# Patient Record
Sex: Female | Born: 1945 | Race: White | Hispanic: No | Marital: Married
Health system: Midwestern US, Academic
[De-identification: ages and names within clinical notes are randomized; demographics above are authoritative.]

## PROBLEM LIST (undated history)

## (undated) DIAGNOSIS — M1990 Unspecified osteoarthritis, unspecified site: Secondary | ICD-10-CM

## (undated) DIAGNOSIS — I10 Essential (primary) hypertension: Secondary | ICD-10-CM

## (undated) DIAGNOSIS — I2699 Other pulmonary embolism without acute cor pulmonale: Secondary | ICD-10-CM

## (undated) DIAGNOSIS — R7983 Abnormal findings of blood amino-acid level: Secondary | ICD-10-CM

## (undated) HISTORY — PX: SHOULDER ARTHROSCOPY: SHX128

## (undated) HISTORY — PX: CHOLECYSTECTOMY: SHX55

## (undated) HISTORY — PX: ABDOMINAL HYSTERECTOMY: SHX81

---

## 1999-05-10 ENCOUNTER — Encounter: Payer: Self-pay | Admitting: Family Medicine

## 1999-05-10 ENCOUNTER — Encounter: Admission: RE | Admit: 1999-05-10 | Discharge: 1999-05-10 | Payer: Self-pay | Admitting: Family Medicine

## 1999-05-17 ENCOUNTER — Encounter: Payer: Self-pay | Admitting: General Surgery

## 1999-05-19 ENCOUNTER — Encounter: Payer: Self-pay | Admitting: General Surgery

## 1999-05-19 ENCOUNTER — Ambulatory Visit (HOSPITAL_COMMUNITY): Admission: RE | Admit: 1999-05-19 | Discharge: 1999-05-20 | Payer: Self-pay | Admitting: General Surgery

## 1999-05-20 ENCOUNTER — Encounter: Payer: Self-pay | Admitting: General Surgery

## 2013-05-05 ENCOUNTER — Telehealth

## 2013-05-05 MED ORDER — FA-PYRIDOXINE-CYANOCOBALAMIN 2.5-25-2 MG PO TABS
ORAL_TABLET | Freq: Every day | ORAL | Status: DC
Start: 2013-05-05 — End: 2013-10-06

## 2013-05-05 NOTE — Telephone Encounter (Signed)
[facility] [email or web address]

## 2013-05-05 NOTE — Telephone Encounter (Signed)
Patient told will call back tomorrow,will check with Dr Arbie Cookey

## 2013-05-05 NOTE — Telephone Encounter (Signed)
Dr Arbie Cookey stated ok to refill, rx sent to pharmacy. Patient verbalize understanding.

## 2013-05-13 ENCOUNTER — Telehealth

## 2013-05-13 NOTE — Telephone Encounter (Signed)
Patient called about she got a statement for her insurance and the Xarelto is going to cost her too much. Patient is requesting to be switched back to Coumadin. [email or web address]

## 2013-05-14 NOTE — Telephone Encounter (Signed)
Dr Arbie Cookey stated ok to stop the Xarelto, and 24 hours later to restart Coumadin 3 mg daily, and have a INR check 5 days later. Appt date 05/20/13 10:30 am [facility] office. The patient stated her last Xarelto was taken on Tues 05/13/13 10 pm, ok per Talbert Forest to start the coumadin today 05/14/13 at 10 pm. Patient verbalize understanding.

## 2013-05-16 NOTE — Telephone Encounter (Signed)
See previous note

## 2013-05-20 LAB — PROTIME-INR: INR: 2.2 — ABNORMAL HIGH (ref 0.8–1.2)

## 2013-05-20 NOTE — Progress Notes (Signed)
INT 2.2. Patient has been on coumadin 3 mg for 5 days. Instructed to continue same and check in 1 week. Verbalized understanding.

## 2013-05-27 LAB — PROTIME-INR: INR: 2.9 — ABNORMAL HIGH (ref 0.8–1.2)

## 2013-05-27 NOTE — Progress Notes (Signed)
INR 2.9. Patient will continue 3 mg coumadin a day Check INR in 1 week. Verbalized understanding.

## 2013-06-03 LAB — PROTIME-INR: INR: 3.4 — ABNORMAL HIGH (ref 0.8–1.2)

## 2013-06-03 NOTE — Progress Notes (Signed)
Paylin's INR today is 3.4    Current Coumadin dose is 3mg  daily.    Per MD order, Dois Davenport instructed to continue current Coumadin dose.    RTC for next INR in 2 weeks on 06/17/13    Pt verbalized understanding.

## 2013-06-17 LAB — PROTIME-INR: INR: 4.1 — ABNORMAL HIGH (ref 0.8–1.2)

## 2013-06-17 NOTE — Progress Notes (Signed)
INR 4.1  Instructed to hold coumadin 1 day  Check INR in 2 week per Dr Bosie HelperKudalkar's order.  Verbalized understanding.

## 2013-07-01 LAB — PROTIME-INR: INR: 2.6 — ABNORMAL HIGH (ref 0.8–1.2)

## 2013-07-01 MED ORDER — WARFARIN SODIUM 3 MG PO TABS
3 MG | ORAL_TABLET | Freq: Every day | ORAL | Status: AC
Start: 2013-07-01 — End: ?

## 2013-07-01 NOTE — Progress Notes (Signed)
INR 2.6. (goal 3-3.5) She states she is alternating 2 mg with 3 mg. Last documentation is 3 mg qd. Instructed to take 3 mg qd and have the INR checked in 1 week. Verbalized understanding.

## 2013-07-08 ENCOUNTER — Encounter

## 2013-07-08 LAB — PROTIME-INR: INR: 2.8 — ABNORMAL HIGH (ref 0.8–1.2)

## 2013-07-08 MED ORDER — WARFARIN SODIUM 6 MG PO TABS
6 MG | ORAL_TABLET | ORAL | Status: DC
Start: 2013-07-08 — End: 2013-07-29

## 2013-07-08 NOTE — Progress Notes (Signed)
Inr should be 3.0-3.5. INR 2.8 on Coumadin 3mg  PO daily. Instructed the patient to take 6mg  for one day then resume 3mg . Recheck INR in one week. Understanding verbalized.

## 2013-07-15 LAB — PROTIME-INR: INR: 3.7 — ABNORMAL HIGH (ref 0.8–1.2)

## 2013-07-15 MED ORDER — WARFARIN SODIUM 6 MG PO TABS
6 MG | ORAL_TABLET | ORAL | Status: DC
Start: 2013-07-15 — End: 2013-09-15

## 2013-07-15 NOTE — Progress Notes (Signed)
INR 3.7. She would like to stay on 3 mg qd. Informed she can stay on 3 mg qd per Dr Arbie CookeyKudalkar. INR in 2 weeks. Verbalized understanding.

## 2013-07-29 LAB — PROTIME-INR: INR: 3 — ABNORMAL HIGH (ref 0.8–1.2)

## 2013-07-29 NOTE — Progress Notes (Signed)
INR 3. Instructed to continue 3 mg coumadin daily. Check INR in 4 weeks. Verbalized understanding.

## 2013-08-26 LAB — PROTIME-INR: INR: 3 — ABNORMAL HIGH (ref 0.8–1.2)

## 2013-08-26 NOTE — Progress Notes (Signed)
INR 3.0. Instructed to continue 3 mg coumadin daily. Check INR at next appt 09/15/13. Verbalized understanding.

## 2013-09-15 LAB — PROTIME-INR: INR: 2.7 — ABNORMAL HIGH (ref 0.8–1.2)

## 2013-09-15 MED ORDER — WARFARIN SODIUM 6 MG PO TABS
6 MG | ORAL_TABLET | ORAL | Status: DC
Start: 2013-09-15 — End: 2014-09-21

## 2013-09-15 NOTE — Progress Notes (Signed)
HEMATOLOGY FOLLOW-UP:       Primary Hematologist:  Nilda RiggsPrasad Chynna Buerkle     DIAGNOSIS:      1. Recurrent pulmonary embolisms.    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    TREATMENTS GIVEN:         CURRENT TREATMENT:     Jantoven    OTHER MEDICAL PROBLEMS:     1. RA  2. HTN  3. Hyperlipidemia    INTERVAL HISTORY:       Patient returns to office for f/u  Doing well.   Could not afford Xarelto, so she is back on Warfarin    No headaches, Dizziness  No mouth sores,   No CP, SOB, Cough  No nausea. Anorexia +. No constipation, diarrhea.  No urinary frequency.  No leg swelling  No tingling, numbness in extremities.  No skin rashes.  No fever, chills, night sweats.      PHYSICAL EXAM:       BP 110/80    Pulse 92    Temp(Src) 98.4 ??F (36.9 ??C) (Oral)    Resp 16    Ht 5\' 3"  (1.6 m)    Wt 194 lb (87.998 kg)    BMI 34.37 kg/m2    Performance Status 0     General appearance: alert and cooperative  Head: Normocephalic, without obvious abnormality, atraumatic  Eyes: No Pallor, icterus  Oral mucosa: No mucositis, thrush  Neck: Supple, No fullness  Lungs: Clear to auscultation bilaterally, no audible rales, wheezes or crackles. Respiratory efforts normal.  Heart: Regular rate and rhythm, S1, S2 normal  Abdomen: Soft, non-tender; bowel sounds normal; no masses,  no organomegaly. Not distended.  Extremities: without cyanosis, clubbing, edema or asymmetry  Skin: No jaundice, purpura or petechiae  Neuro: No focal deficits, cranial nerve palsies      LABS:     CBC:   No results found for this basename: WBC, NEUTROABS, HGB, MCV, PLT       BMP:   No results found for this basename: NA, K, CHLORIDE, CO2, GLU, BUN, CREATININE, MG, TSH       HEPATIC:  No results found for this basename: AST, ALT, ALKPHOS, PROT, ALB, BILITOT, BILIDIR, GGT, URICACID, LDH       TUMOR MARKERS:   No results found for this basename: PSA, CEA, CA125, CA2729, CA199         IMAGING:         ASSESSMENT AND PLAN:       1.  Patient with recurrent pulmonary  embolisms.  The patient is currently on Jantoven and is tolerating it well.  It will be adjusted to maintain INR between 3 and 3.5.    2. Homocysteinemia: Foltx will be continued.    CBC and CMP from [facility] was reviewed.    F/U IN 1 YEAR    Nilda RiggsPrasad Larken Urias, MD       cc:  Raeanne BarryJoel Reginelli, M.D.  Zachery DauerBerthold Pembaur, M.D.  Nira ConnSri Koneru, M.D.

## 2013-09-16 NOTE — Telephone Encounter (Signed)
noted 

## 2013-09-16 NOTE — Telephone Encounter (Signed)
Patient moved appt for lab only from 4/22 to 4/21   Done /p f

## 2013-09-30 LAB — PROTIME-INR: INR: 4.1 — ABNORMAL HIGH (ref 0.8–1.2)

## 2013-09-30 NOTE — Progress Notes (Signed)
Please refer to the lab/only visit from today for additional documentation on labs drawn.  PT/INR,drawn today. Patient without prior bruising, discoloration or other noted abnormalities to the draw site.  There was minimalbruising or bleeding noted post blood draw at the site.  Patient without complaints.

## 2013-09-30 NOTE — Progress Notes (Signed)
INR goal 3.0-4.0. INR 4.1 instructed to hold todays dose then resume at 3mg  PO daily. Recheck INR in two weeks. Understanding verbalized.

## 2013-10-06 ENCOUNTER — Telehealth

## 2013-10-06 MED ORDER — FA-PYRIDOXINE-CYANOCOBALAMIN 2.5-25-2 MG PO TABS
ORAL_TABLET | Freq: Every day | ORAL | Status: DC
Start: 2013-10-06 — End: 2014-02-02

## 2013-10-06 NOTE — Telephone Encounter (Signed)
rx sent to pharmacy patient called.

## 2013-10-06 NOTE — Telephone Encounter (Signed)
The patient called with the following medication request: folic acid-pyridoxine-cyancobalamin (FOLTX) 2.5-25-2 MG TABS with b12 combination. Patient states that the office was supposed to call in the medication but the pharmacy has heard nothing.   [facility], [phone].  Trula Sladeacruey

## 2013-10-14 LAB — PROTIME-INR: INR: 2.9 — ABNORMAL HIGH (ref 0.8–1.2)

## 2013-10-14 NOTE — Progress Notes (Signed)
INR 3.0. Instructed to continue 3 mg coumadin a day. INR in 4 weeks. Call if any sign of bleeding or bruising. Verbalized understanding.

## 2013-10-14 NOTE — Progress Notes (Signed)
Please refer to the lab/ visit from today for additional documentation on labs drawn.  PT/INR,  drawn today. Patient without prior bruising, discoloration or other noted abnormalities to the draw site.  There was minimalbruising or bleeding noted post blood draw at the site.  Patient without complaints.  Patient requesting more refills on script when calling in.  Having trouble when calling Rx in, taking 4-5 days.

## 2013-11-05 NOTE — Telephone Encounter (Signed)
Catherine Brewer from [employer] called about clarification on an electronic prescription she received. She stated it wasn't urgent and could be handled next day.

## 2013-11-06 NOTE — Telephone Encounter (Signed)
Lelon Mast called for clarification of prescription for foltx. Please call back.

## 2013-11-06 NOTE — Telephone Encounter (Signed)
Dr Arbie Cookey stated okay to give Folbic.pharmacy called.

## 2013-11-06 NOTE — Telephone Encounter (Signed)
Dr Arbie Cookey stated okay to give the [facility], pharmacy called.

## 2013-11-11 LAB — PROTIME-INR: INR: 3.2 — ABNORMAL HIGH (ref 0.8–1.2)

## 2013-11-11 NOTE — Progress Notes (Signed)
INR 3.2. Instructed to continue 3 mg coumadin a day. Check the INR in 4 weeks. Verbalized understanding.

## 2013-11-11 NOTE — Progress Notes (Signed)
Please refer to the lab/ visit from today for additional documentation on labs drawn.  PT/INR,  drawn today. Patient without prior bruising, discoloration or other noted abnormalities to the draw site.  There was minimalbruising or bleeding noted post blood draw at the site.  Patient with complaints.  Patient states she has swelling in right leg below knee and pain goes all the was down shin.

## 2013-11-11 NOTE — Telephone Encounter (Signed)
Noted.

## 2013-11-11 NOTE — Telephone Encounter (Signed)
Patient called to change lab appointment time on 12/09/13 from 3 pm to 9 am due to other appointment. Appointment has been changed. [place]

## 2013-12-09 LAB — PROTIME-INR: INR: 3.3 — ABNORMAL HIGH (ref 0.8–1.2)

## 2013-12-09 NOTE — Progress Notes (Signed)
INR 3.3. Patient to continue coumadin 3 mg qd. Check the INR in 4 weeks.

## 2013-12-09 NOTE — Progress Notes (Signed)
Please refer to the lab/ visit from today for additional documentation on labs drawn.  PT/INR,  drawn today. Patient without prior bruising, discoloration or other noted abnormalities to the draw site.  There was minimalbruising or bleeding noted post blood draw at the site.  Patient without complaints.

## 2013-12-29 NOTE — Telephone Encounter (Signed)
Patient would like to know if she will remain on her coumadin, she will be having an epidural to relieve pain from a bulging disc. Please call to advise.

## 2013-12-29 NOTE — Telephone Encounter (Signed)
Informed it is OK with Dr Arbie Cookey for her to stay on coumadin for the steroid injection, per Dr Arbie Cookey. Verbalized understanding.

## 2014-01-06 LAB — PROTIME-INR: INR: 3.1 — ABNORMAL HIGH (ref 0.8–1.2)

## 2014-01-06 NOTE — Progress Notes (Signed)
Please refer to the lab/ visit from today for additional documentation on labs drawn.  PT/INR,  drawn today. Patient without prior bruising, discoloration or other noted abnormalities to the draw site.  There was minimalbruising or bleeding noted post blood draw at the site.  Patient without complaints.

## 2014-02-02 ENCOUNTER — Telehealth

## 2014-02-02 MED ORDER — FA-PYRIDOXINE-CYANOCOBALAMIN 2.5-25-2 MG PO TABS
ORAL_TABLET | Freq: Every day | ORAL | Status: DC
Start: 2014-02-02 — End: 2014-05-29

## 2014-02-02 NOTE — Telephone Encounter (Signed)
Patient called requesting refill on FOLTX 2.5-25-2mg . Pharmacy- [facility], Phone [phone]. Catherine Brewer

## 2014-02-03 LAB — PROTIME-INR: INR: 3.1 — ABNORMAL HIGH (ref 0.8–1.2)

## 2014-02-03 NOTE — Progress Notes (Signed)
INR 3.1. Instructed to continue coumadin 3 mg qd. Check INR 4 weeks.

## 2014-02-03 NOTE — Progress Notes (Signed)
Please refer to the lab/ visit from today for additional documentation on labs drawn.  PT/INR,  drawn today. Patient without prior bruising, discoloration or other noted abnormalities to the draw site.  There was minimalbruising or bleeding noted post blood draw at the site.  Patient without complaints.

## 2014-03-03 LAB — PROTIME-INR: INR: 3.2 — ABNORMAL HIGH (ref 0.8–1.2)

## 2014-03-03 NOTE — Progress Notes (Signed)
INR 3.2. Message left for patient to continue coumadin 3 mg qd. Check the INR in 4 weeks. Call if questions.

## 2014-03-03 NOTE — Progress Notes (Signed)
Please refer to the labnurse* visit from today for additional documentation on labs drawn.  PT/INR,drawn today. Patient without prior bruising, discoloration or other noted abnormalities to the draw site.  There was minimalbruising or bleeding noted post blood draw at the site.  Patient without complaints.

## 2014-03-31 LAB — PROTIME-INR: INR: 3.6 — ABNORMAL HIGH (ref 0.8–1.2)

## 2014-03-31 NOTE — Progress Notes (Signed)
INR 3.6. Instructed to hold coumadin today then take 3 mg daily. Check INR in 2 weeks. Per Dr Bosie HelperKudalkar's order. Verbalized understanding.

## 2014-03-31 NOTE — Progress Notes (Signed)
Please refer to the lab visit from today for additional documentation on labs drawn.  PT/INR drawn today. Patient without prior bruising, discoloration or other noted abnormalities to the draw site.  There was minimalbruising or bleeding noted post blood draw at the site.  Patient without complaints.

## 2014-04-13 ENCOUNTER — Encounter: Admit: 2014-04-13 | Discharge: 2014-04-13 | Payer: MEDICARE

## 2014-04-13 ENCOUNTER — Ambulatory Visit: Payer: MEDICARE | Attending: Hematology & Oncology

## 2014-04-13 DIAGNOSIS — Z7901 Long term (current) use of anticoagulants: Secondary | ICD-10-CM

## 2014-04-13 LAB — PROTIME-INR: INR: 2.3 — ABNORMAL HIGH (ref 0.8–1.2)

## 2014-04-13 NOTE — Progress Notes (Signed)
INR 2.3. Instructed to take coumadin 4 mg qd per Dr Bosie HelperKudalkar's order. Check the INR in 1 week. Verbalized understanding.

## 2014-04-13 NOTE — Progress Notes (Signed)
Please refer to the lab visit from today for additional documentation on labs drawn.  PT/INR drawn today. Patient without prior bruising, discoloration or other noted abnormalities to the draw site.  There was minimalbruising or bleeding noted post blood draw at the site.  Patient without complaints.

## 2014-04-21 ENCOUNTER — Ambulatory Visit: Payer: MEDICARE | Attending: Hematology & Oncology

## 2014-04-21 ENCOUNTER — Encounter: Admit: 2014-04-21 | Discharge: 2014-04-21 | Payer: MEDICARE

## 2014-04-21 DIAGNOSIS — Z7901 Long term (current) use of anticoagulants: Secondary | ICD-10-CM

## 2014-04-21 LAB — PROTIME-INR: INR: 4.9 — ABNORMAL HIGH (ref 0.8–1.2)

## 2014-04-21 NOTE — Progress Notes (Signed)
INR 4.9. Instructed to hold coumadin today then take 3 mg qd. Check the INR in 1 week per Dr Bosie HelperKudalkar's order. Verbalized understanding.

## 2014-04-21 NOTE — Progress Notes (Signed)
Please refer to the lab/ visit from today for additional documentation on labs drawn.  PT/INR,  drawn today. Patient without prior bruising, discoloration or other noted abnormalities to the draw site.  There was minimalbruising or bleeding noted post blood draw at the site.  Patient without complaints.

## 2014-04-28 ENCOUNTER — Encounter: Admit: 2014-04-28 | Discharge: 2014-04-28 | Payer: MEDICARE

## 2014-04-28 ENCOUNTER — Ambulatory Visit: Payer: MEDICARE | Attending: Hematology & Oncology

## 2014-04-28 DIAGNOSIS — Z7901 Long term (current) use of anticoagulants: Secondary | ICD-10-CM

## 2014-04-28 LAB — PROTIME-INR: INR: 2.8 — ABNORMAL HIGH (ref 0.8–1.2)

## 2014-04-28 NOTE — Progress Notes (Signed)
Please refer to the lab visit from today for additional documentation on labs drawn.  PT/INR, drawn today. Patient without prior bruising, discoloration or other noted abnormalities to the draw site.  There was minimalbruising or bleeding noted post blood draw at the site.  Patient without complaints.

## 2014-04-28 NOTE — Progress Notes (Signed)
INR 2.8.Instructed to take 4 mg on Tuesdays, 3 mg the rest of the week. Check the INR in 2 weeks.

## 2014-05-12 ENCOUNTER — Ambulatory Visit: Payer: MEDICARE | Attending: Hematology & Oncology

## 2014-05-12 ENCOUNTER — Encounter: Admit: 2014-05-12 | Discharge: 2014-05-12 | Payer: MEDICARE

## 2014-05-12 DIAGNOSIS — Z7901 Long term (current) use of anticoagulants: Secondary | ICD-10-CM

## 2014-05-12 LAB — PROTIME-INR: INR: 2.8 — ABNORMAL HIGH (ref 0.8–1.2)

## 2014-05-12 NOTE — Progress Notes (Signed)
INR 2.8. Patient would like to continue the same dose of coumadin 4 mg on Tuesdays and 3 mg the rest of the week. Check the INR in 2 weeks. Dr Arbie CookeyKudalkar informed.     Patient states she went to the ER a week ago Sunday for chest pain and SOB. They R/O cardiac problem and PE. She has FU with the primary care. Still having symptoms.  She is given the phone number for [facility] Cardiologist per her request.

## 2014-05-12 NOTE — Progress Notes (Signed)
Please refer to the labnurse visit from today for additional documentation on labs drawn.  PT/INR,drawn today. Patient with prior bruising, discoloration or other noted abnormalities to the draw site.  There was mildbruising or bleeding noted post blood draw at the site.  Patient without complaints.

## 2014-05-26 ENCOUNTER — Encounter: Admit: 2014-05-26 | Discharge: 2014-05-26 | Payer: MEDICARE

## 2014-05-26 ENCOUNTER — Ambulatory Visit: Payer: MEDICARE | Attending: Hematology & Oncology

## 2014-05-26 DIAGNOSIS — Z7901 Long term (current) use of anticoagulants: Secondary | ICD-10-CM

## 2014-05-26 LAB — PROTIME-INR: INR: 3.8 — ABNORMAL HIGH (ref 0.8–1.2)

## 2014-05-26 NOTE — Progress Notes (Signed)
INR is 3.8. She states she will go back on coumadin 3 mg qd and check the INR in 3 weeks.

## 2014-05-26 NOTE — Progress Notes (Signed)
Please refer to the lab/ visit from today for additional documentation on labs drawn.  PT/INR,  drawn today. Patient without prior bruising, discoloration or other noted abnormalities to the draw site.  There was minimalbruising or bleeding noted post blood draw at the site.  Patient without complaints.

## 2014-05-29 MED ORDER — FOLBIC 2.5-25-2 MG PO TABS
ORAL_TABLET | ORAL | Status: DC
Start: 2014-05-29 — End: 2014-06-30

## 2014-06-16 ENCOUNTER — Encounter: Admit: 2014-06-16 | Discharge: 2014-06-16 | Payer: MEDICARE

## 2014-06-16 ENCOUNTER — Ambulatory Visit: Payer: MEDICARE | Attending: Hematology & Oncology

## 2014-06-16 DIAGNOSIS — Z7901 Long term (current) use of anticoagulants: Secondary | ICD-10-CM

## 2014-06-16 LAB — PROTIME-INR: INR: 3.2 — ABNORMAL HIGH (ref 0.8–1.2)

## 2014-06-16 NOTE — Progress Notes (Signed)
Please refer to the lab/ visit from today for additional documentation on labs drawn.  PT/INR,  drawn today. Patient without prior bruising, discoloration or other noted abnormalities to the draw site.  There was minimalbruising or bleeding noted post blood draw at the site.  Patient without complaints.

## 2014-06-16 NOTE — Progress Notes (Signed)
INR 3.2. Instructed to continue coumadin 3 mg qd. Check the INR in 4 weeks. Verbalized understanding.

## 2014-06-30 MED ORDER — FOLBIC 2.5-25-2 MG PO TABS
ORAL_TABLET | ORAL | Status: DC
Start: 2014-06-30 — End: 2014-07-28

## 2014-06-30 NOTE — Telephone Encounter (Signed)
Refill for Folbic tabs sent to th epharmacy

## 2014-07-14 ENCOUNTER — Encounter: Admit: 2014-07-14 | Discharge: 2014-07-14 | Payer: MEDICARE

## 2014-07-14 ENCOUNTER — Ambulatory Visit: Payer: MEDICARE | Attending: Hematology & Oncology

## 2014-07-14 DIAGNOSIS — Z7901 Long term (current) use of anticoagulants: Secondary | ICD-10-CM

## 2014-07-14 LAB — PROTIME-INR: INR: 3.4 — ABNORMAL HIGH (ref 0.8–1.2)

## 2014-07-14 NOTE — Progress Notes (Signed)
INR 3.4. Instructed to continue same dose of coumadin 3 mg qd. Check the INR in 4 weeks. Verbalized understanding.

## 2014-07-14 NOTE — Progress Notes (Signed)
Please refer to the lab visit from today for additional documentation on labs drawn.  PT/INR, drawn today. Patient without prior bruising, discoloration or other noted abnormalities to the draw site.  There was minimal bruising or bleeding noted post blood draw at the site.  Patient without complaints.

## 2014-07-28 MED ORDER — FOLBIC 2.5-25-2 MG PO TABS
ORAL_TABLET | ORAL | Status: DC
Start: 2014-07-28 — End: 2014-09-01

## 2014-08-11 ENCOUNTER — Encounter: Admit: 2014-08-11 | Discharge: 2014-08-11 | Payer: MEDICARE

## 2014-08-11 ENCOUNTER — Ambulatory Visit: Payer: MEDICARE | Attending: Hematology & Oncology

## 2014-08-11 DIAGNOSIS — Z7901 Long term (current) use of anticoagulants: Secondary | ICD-10-CM

## 2014-08-11 LAB — PROTIME-INR: INR: 3.2 — ABNORMAL HIGH (ref 0.8–1.2)

## 2014-08-11 NOTE — Progress Notes (Signed)
Please refer to the lab/ visit from today for additional documentation on labs drawn.  PT/INR,  drawn today. Patient without prior bruising, discoloration or other noted abnormalities to the draw site.  There was minimalbruising or bleeding noted post blood draw at the site.  Patient without complaints.

## 2014-08-11 NOTE — Progress Notes (Signed)
INR 3.2. Instructed to continue coumadin 3 mg qd. Check the INR in 4 weeks. Verbalized understanding.

## 2014-08-18 NOTE — Telephone Encounter (Signed)
Patient called and said she will be having a procedure done and will need to speak to the NN about stopping her coumadin and bridging with Lovenox   Please call thanks/ pf

## 2014-08-18 NOTE — Telephone Encounter (Signed)
Patient states she needs an injection into a bulging disc. She will need to stop coumadin for the injection. Her insurance will not pay for Lovenox. A POT for [name] is entered. She will find out the date the injection will be done and let me know.

## 2014-08-19 NOTE — Telephone Encounter (Signed)
Patient informed [name] is approved to be given in theoffice if she needs to stop coumadin get [name] injections for the planned spinal injection. Just let me know when the injection is and if she needs to stop coumadin. Verbalized understanding.

## 2014-08-25 NOTE — Telephone Encounter (Signed)
Patient called and said she is having a procedure on 3/25 and will need to stop her coumadin and get Lovenox injections   Please call thanks /p f

## 2014-08-26 NOTE — Telephone Encounter (Signed)
Reviewed instructions again to stop coumadin 08/30/14. [name] injections in the office 09/01/14 and 09/02/14. Nothing until after the procedure on 09/04/14. 09/05/14 restart [name] and coumadin. Check the INR 09/08/14. Verbalized understanding.

## 2014-08-26 NOTE — Telephone Encounter (Signed)
Patient instructed to stop coumadin 08/30/14.  [name] injections in the office 09/01/14 and 09/02/14. Nothing until after the procedure on 09/04/14. 09/05/14 restart [name] and coumadin. Check the INR 09/08/14. Orders per Dr Arbie CookeyKudalkar. Verbalized understanding. [facility].

## 2014-08-26 NOTE — Telephone Encounter (Signed)
Patient called with questions regarding medication. She stated she needed verification on the medications that she spoke to LynnShirley about. Please call to advise.

## 2014-08-31 NOTE — Telephone Encounter (Signed)
Patient called to schedule an injection appointment for 3/23. Please contact.

## 2014-08-31 NOTE — Telephone Encounter (Signed)
Done all scheduled jmc

## 2014-09-01 ENCOUNTER — Encounter: Admit: 2014-09-01 | Discharge: 2014-09-01 | Payer: MEDICARE

## 2014-09-01 DIAGNOSIS — Z7901 Long term (current) use of anticoagulants: Secondary | ICD-10-CM

## 2014-09-01 MED ORDER — FONDAPARINUX SODIUM 7.5 MG/0.6ML SC SOLN
7.5 MG/0.6ML | Freq: Every day | SUBCUTANEOUS | Status: DC
Start: 2014-09-01 — End: 2014-09-01
  Administered 2014-09-01: 14:00:00 7.5 mg via SUBCUTANEOUS

## 2014-09-01 MED ORDER — FA-PYRIDOXINE-CYANOCOBALAMIN 2.5-25-2 MG PO TABS
ORAL_TABLET | ORAL | Status: DC
Start: 2014-09-01 — End: 2015-01-29

## 2014-09-01 MED FILL — FONDAPARINUX SODIUM 7.5 MG/0.6ML SC SOLN: 7.5 MG/0.6ML | SUBCUTANEOUS | Qty: 0.6

## 2014-09-01 NOTE — Telephone Encounter (Signed)
Refill for Fobic sent to the pharmacy per patient request.

## 2014-09-01 NOTE — Progress Notes (Signed)
Patient education given on [name] and the patient expresses understanding and acceptance of instructions. Catherine Brewer 09/01/2014     Patient here for [name] injection, she denies complaints. Plan is for patient to get [name] injections today and tomorrow and then hold until 3/26 (after procedure.) Patient will get final [id number]26 and also resume Coumadin, then return to office 3/29 for INR check. Med education materials given to patient regarding [name] and consent signed.  Patient tolerated injection into abdominal tissue without complaints. Voices understanding on follow up tomorrow for injection.    Discharge Checklist  Clinic discharge accompanied by Self    Mode of transportation: Ambulatory    Clinic Discharge Note:  Treatment was admistered as prescribed without adverse event, with physician oversight.  Universal Precautions observed with treatment.  Patient instructed to call office with any questions or problems. Discharge Teaching instruction  Patient Physical Status adequate  Verify on flowchart all medications due today have been administered and documented.  Return appointment scheduled  Discharged from clinic   Catherine Brewer

## 2014-09-01 NOTE — Patient Instructions (Signed)
Clinic Discharge Note     Clinic Discharge Note: Treatment was administered as prescribed without adverse event, with physician oversight.;Universal precautions observed with treatment;Patient instructed to call office with any questions or problems.  Discharge Teaching Instructions.;Verify on MAR all medications due today have been administered and documented on.;Return appointment scheduled;Discharged from clinic.    Patients Receiving  Treatment:  Report to your physician's office immediately or call  [phone]:              1.  Fever of 100.5 or greater.                2.  Vomiting without relief after using anti-nausea/vomiting medicine.                3.  Severe abdominal cramping/diarrhea, 3-5 watery bowel movements in a day.                4.  Pain at the IV site where you received your chemotherapy.                5.  Unusual or excessive bleeding from your mouth, nose, rectum, or vagina;  blood in your urine.                6.  A sudden onset of shortness of breath or chest pain.    Report to your physician's office within 24 hours:              1.  Pain not relieved by your usual pain medication.                2.  Signs of infection; redness, warmth, or swelling.                3.  Unusual bruising of any body area.                4.  Change in breathing patterns such as difficulty breathing or shortness of breath when you are resting.                5.  Change in urination; cloudiness, odor, frequency, or pain on urination.    Report to your physician's office within 48 hours:              1.  Skin changes: rash, hives, itching, redness and peeling of the skin.                2.  Numbness, tingling in the extremities or a persistent ringing in the ears.    Practice good personal hygiene.  If possible, avoid persons with colds or flu-like symptoms.  Drink plenty of fluids while awake and avoid exposure to the sun.  It is not unusual to experience other side effects, such as loss of appetite, altered  taste sensation or depression.

## 2014-09-02 ENCOUNTER — Encounter: Admit: 2014-09-02 | Discharge: 2014-09-02 | Payer: MEDICARE

## 2014-09-02 DIAGNOSIS — Z7901 Long term (current) use of anticoagulants: Secondary | ICD-10-CM

## 2014-09-02 MED ORDER — FONDAPARINUX SODIUM 7.5 MG/0.6ML SC SOLN
7.5 MG/0.6ML | Freq: Every day | SUBCUTANEOUS | Status: DC
Start: 2014-09-02 — End: 2014-09-02
  Administered 2014-09-02: 14:00:00 7.5 mg via SUBCUTANEOUS

## 2014-09-02 MED FILL — FONDAPARINUX SODIUM 7.5 MG/0.6ML SC SOLN: 7.5 MG/0.6ML | SUBCUTANEOUS | Qty: 0.6

## 2014-09-02 NOTE — Patient Instructions (Signed)
Clinic Discharge Note     Clinic Discharge Note: Treatment was administered as prescribed without adverse event, with physician oversight.;Universal precautions observed with treatment;Patient instructed to call office with any questions or problems.  Discharge Teaching Instructions.;Verify on MAR all medications due today have been administered and documented on.;Return appointment scheduled;Discharged from clinic.    Patients Receiving  Treatment:  Report to your physician's office immediately or call  [phone]:              1.  Fever of 100.5 or greater.                2.  Vomiting without relief after using anti-nausea/vomiting medicine.                3.  Severe abdominal cramping/diarrhea, 3-5 watery bowel movements in a day.                4.  Pain at the IV site where you received your chemotherapy.                5.  Unusual or excessive bleeding from your mouth, nose, rectum, or vagina;  blood in your urine.                6.  A sudden onset of shortness of breath or chest pain.    Report to your physician's office within 24 hours:              1.  Pain not relieved by your usual pain medication.                2.  Signs of infection; redness, warmth, or swelling.                3.  Unusual bruising of any body area.                4.  Change in breathing patterns such as difficulty breathing or shortness of breath when you are resting.                5.  Change in urination; cloudiness, odor, frequency, or pain on urination.    Report to your physician's office within 48 hours:              1.  Skin changes: rash, hives, itching, redness and peeling of the skin.                2.  Numbness, tingling in the extremities or a persistent ringing in the ears.    Practice good personal hygiene.  If possible, avoid persons with colds or flu-like symptoms.  Drink plenty of fluids while awake and avoid exposure to the sun.  It is not unusual to experience other side effects, such as loss of appetite, altered  taste sensation or depression.

## 2014-09-02 NOTE — Progress Notes (Signed)
Pt to tx suite for [name] injection, tolerated well w/o complications. Pt verbalized understanding of plan.     Discharge Checklist  Clinic discharge accompanied by Self    Mode of transportation: Ambulatory    Clinic Discharge Note:  Treatment was admistered as prescribed without adverse event, with physician oversight.  Universal Precautions observed with treatment.  Patient instructed to call office with any questions or problems. Discharge Teaching instruction  Patient Physical Status adequate  Verify on flowchart all medications due today have been administered and documented.  Return appointment scheduled  Discharged from clinic     [facility] James IvanoffM Aysel Gilchrest, RN

## 2014-09-05 ENCOUNTER — Encounter: Admit: 2014-09-05 | Discharge: 2014-09-06 | Payer: MEDICARE

## 2014-09-05 DIAGNOSIS — Z7901 Long term (current) use of anticoagulants: Secondary | ICD-10-CM

## 2014-09-05 MED ORDER — FONDAPARINUX SODIUM 7.5 MG/0.6ML SC SOLN
7.5 MG/0.6ML | Freq: Every day | SUBCUTANEOUS | Status: DC
Start: 2014-09-05 — End: 2014-09-05
  Administered 2014-09-05: 15:00:00 7.5 mg via SUBCUTANEOUS

## 2014-09-05 MED FILL — FONDAPARINUX SODIUM 7.5 MG/0.6ML SC SOLN: 7.5 MG/0.6ML | SUBCUTANEOUS | Qty: 0.6

## 2014-09-05 NOTE — Progress Notes (Signed)
Pt here for [name]. This is the first injection since her procedure yesterday. She was instructed to begin her coumadin tonight as per progress note. Denies any unusual bleeding or bruising. Having some pain and stiffness in back from procedure. Returns tomorrow. EO  Discharge Checklist  Clinic discharge accompanied by Self    Mode of transportation: Ambulatory    Clinic Discharge Note:  Treatment was admistered as prescribed without adverse event, with physician oversight.  Universal Precautions observed with treatment.  Patient instructed to call office with any questions or problems. Discharge Teaching instruction  Patient Physical Status adequate  Verify on flowchart all medications due today have been administered and documented.  Discharged from clinic

## 2014-09-05 NOTE — Patient Instructions (Signed)
Clinic Discharge Note     Clinic Discharge Note: Treatment was administered as prescribed without adverse event, with physician oversight.;Universal precautions observed with treatment;Patient instructed to call office with any questions or problems.  Discharge Teaching Instructions.;Verify on MAR all medications due today have been administered and documented on.;Return appointment scheduled;Discharged from clinic.    Patients Receiving  Treatment:  Report to your physician's office immediately or call  [phone]:              1.  Fever of 100.5 or greater.                2.  Vomiting without relief after using anti-nausea/vomiting medicine.                3.  Severe abdominal cramping/diarrhea, 3-5 watery bowel movements in a day.                4.  Pain at the IV site where you received your chemotherapy.                5.  Unusual or excessive bleeding from your mouth, nose, rectum, or vagina;  blood in your urine.                6.  A sudden onset of shortness of breath or chest pain.    Report to your physician's office within 24 hours:              1.  Pain not relieved by your usual pain medication.                2.  Signs of infection; redness, warmth, or swelling.                3.  Unusual bruising of any body area.                4.  Change in breathing patterns such as difficulty breathing or shortness of breath when you are resting.                5.  Change in urination; cloudiness, odor, frequency, or pain on urination.    Report to your physician's office within 48 hours:              1.  Skin changes: rash, hives, itching, redness and peeling of the skin.                2.  Numbness, tingling in the extremities or a persistent ringing in the ears.    Practice good personal hygiene.  If possible, avoid persons with colds or flu-like symptoms.  Drink plenty of fluids while awake and avoid exposure to the sun.  It is not unusual to experience other side effects, such as loss of appetite, altered  taste sensation or depression.

## 2014-09-06 ENCOUNTER — Encounter: Admit: 2014-09-06 | Discharge: 2014-09-06 | Payer: MEDICARE

## 2014-09-06 DIAGNOSIS — Z7901 Long term (current) use of anticoagulants: Secondary | ICD-10-CM

## 2014-09-06 MED ORDER — FONDAPARINUX SODIUM 7.5 MG/0.6ML SC SOLN
7.5 MG/0.6ML | Freq: Every day | SUBCUTANEOUS | Status: DC
Start: 2014-09-06 — End: 2014-09-06
  Administered 2014-09-06: 14:00:00 7.5 mg via SUBCUTANEOUS

## 2014-09-06 MED FILL — FONDAPARINUX SODIUM 7.5 MG/0.6ML SC SOLN: 7.5 MG/0.6ML | SUBCUTANEOUS | Qty: 0.6

## 2014-09-06 NOTE — Progress Notes (Signed)
Pt arrives to TX suite today for [name] injection.  Pt tolerated well and will follow up as scheduled.       Discharge Checklist  Clinic discharge accompanied by Self    Mode of transportation: Ambulatory    Clinic Discharge Note:  Treatment was admistered as prescribed without adverse event, with physician oversight.  Universal Precautions observed with treatment.  Patient instructed to call office with any questions or problems. Discharge Teaching instruction  Patient Physical Status adequate  Verify on flowchart all medications due today have been administered and documented.  Return appointment scheduled  Discharged from clinic

## 2014-09-06 NOTE — Patient Instructions (Signed)
Clinic Discharge Note     Clinic Discharge Note: Treatment was administered as prescribed without adverse event, with physician oversight.;Universal precautions observed with treatment;Patient instructed to call office with any questions or problems.  Discharge Teaching Instructions.;Verify on MAR all medications due today have been administered and documented on.;Return appointment scheduled;Discharged from clinic.    Patients Receiving  Treatment:  Report to your physician's office immediately or call  [phone]:              1.  Fever of 100.5 or greater.                2.  Vomiting without relief after using anti-nausea/vomiting medicine.                3.  Severe abdominal cramping/diarrhea, 3-5 watery bowel movements in a day.                4.  Pain at the IV site where you received your chemotherapy.                5.  Unusual or excessive bleeding from your mouth, nose, rectum, or vagina;  blood in your urine.                6.  A sudden onset of shortness of breath or chest pain.    Report to your physician's office within 24 hours:              1.  Pain not relieved by your usual pain medication.                2.  Signs of infection; redness, warmth, or swelling.                3.  Unusual bruising of any body area.                4.  Change in breathing patterns such as difficulty breathing or shortness of breath when you are resting.                5.  Change in urination; cloudiness, odor, frequency, or pain on urination.    Report to your physician's office within 48 hours:              1.  Skin changes: rash, hives, itching, redness and peeling of the skin.                2.  Numbness, tingling in the extremities or a persistent ringing in the ears.    Practice good personal hygiene.  If possible, avoid persons with colds or flu-like symptoms.  Drink plenty of fluids while awake and avoid exposure to the sun.  It is not unusual to experience other side effects, such as loss of appetite, altered  taste sensation or depression.

## 2014-09-07 ENCOUNTER — Encounter: Admit: 2014-09-07 | Discharge: 2014-09-07 | Payer: MEDICARE

## 2014-09-07 DIAGNOSIS — Z7901 Long term (current) use of anticoagulants: Secondary | ICD-10-CM

## 2014-09-07 MED ORDER — FONDAPARINUX SODIUM 7.5 MG/0.6ML SC SOLN
7.5 MG/0.6ML | Freq: Every day | SUBCUTANEOUS | Status: DC
Start: 2014-09-07 — End: 2014-09-07
  Administered 2014-09-07: 14:00:00 7.5 mg via SUBCUTANEOUS

## 2014-09-07 MED FILL — FONDAPARINUX SODIUM 7.5 MG/0.6ML SC SOLN: 7.5 MG/0.6ML | SUBCUTANEOUS | Qty: 0.6

## 2014-09-07 NOTE — Progress Notes (Signed)
Discharge Checklist  Clinic discharge accompanied by Self    Mode of transportation: Ambulatory    Clinic Discharge Note:  Treatment was admistered as prescribed without adverse event, with physician oversight.  Universal Precautions observed with treatment.  Documented problems discussed with MD.  Patient instructed to call office with any questions or problems. Discharge Teaching instruction  Patient Physical Status adequate  Verify on flowchart all medications due today have been administered and documented.  Return appointment scheduled  Discharged from clinic     Here for arixtra. Pt to have INR on 09/08/14

## 2014-09-07 NOTE — Patient Instructions (Signed)
Clinic Discharge Note     Clinic Discharge Note: Treatment was administered as prescribed without adverse event, with physician oversight.;Universal precautions observed with treatment;Patient instructed to call office with any questions or problems.  Discharge Teaching Instructions.;Verify on MAR all medications due today have been administered and documented on.;Return appointment scheduled;Discharged from clinic.    Patients Receiving  Treatment:  Report to your physician's office immediately or call  [phone]:              1.  Fever of 100.5 or greater.                2.  Vomiting without relief after using anti-nausea/vomiting medicine.                3.  Severe abdominal cramping/diarrhea, 3-5 watery bowel movements in a day.                4.  Pain at the IV site where you received your chemotherapy.                5.  Unusual or excessive bleeding from your mouth, nose, rectum, or vagina;  blood in your urine.                6.  A sudden onset of shortness of breath or chest pain.    Report to your physician's office within 24 hours:              1.  Pain not relieved by your usual pain medication.                2.  Signs of infection; redness, warmth, or swelling.                3.  Unusual bruising of any body area.                4.  Change in breathing patterns such as difficulty breathing or shortness of breath when you are resting.                5.  Change in urination; cloudiness, odor, frequency, or pain on urination.    Report to your physician's office within 48 hours:              1.  Skin changes: rash, hives, itching, redness and peeling of the skin.                2.  Numbness, tingling in the extremities or a persistent ringing in the ears.    Practice good personal hygiene.  If possible, avoid persons with colds or flu-like symptoms.  Drink plenty of fluids while awake and avoid exposure to the sun.  It is not unusual to experience other side effects, such as loss of appetite, altered  taste sensation or depression.

## 2014-09-08 ENCOUNTER — Encounter: Admit: 2014-09-08 | Discharge: 2014-09-08 | Payer: MEDICARE

## 2014-09-08 ENCOUNTER — Ambulatory Visit: Payer: MEDICARE | Attending: Hematology & Oncology

## 2014-09-08 DIAGNOSIS — Z7901 Long term (current) use of anticoagulants: Secondary | ICD-10-CM

## 2014-09-08 LAB — PROTIME-INR: INR: 1.1 (ref 0.8–1.2)

## 2014-09-08 MED ORDER — FONDAPARINUX SODIUM 7.5 MG/0.6ML SC SOLN
7.5 MG/0.6ML | Freq: Every day | SUBCUTANEOUS | Status: DC
Start: 2014-09-08 — End: 2014-09-08
  Administered 2014-09-08: 19:00:00 7.5 mg via SUBCUTANEOUS

## 2014-09-08 MED FILL — FONDAPARINUX SODIUM 7.5 MG/0.6ML SC SOLN: 7.5 MG/0.6ML | SUBCUTANEOUS | Qty: 0.6

## 2014-09-08 NOTE — Progress Notes (Signed)
Discharge Checklist  Clinic discharge accompanied by Self    Mode of transportation: Ambulatory    Clinic Discharge Note:  Treatment was admistered as prescribed without adverse event, with physician oversight.  Universal Precautions observed with treatment.  Documented problems discussed with MD.  Patient instructed to call office with any questions or problems. Discharge Teaching instruction  Patient Physical Status adequate  Verify on flowchart all medications due today have been administered and documented.  Return appointment scheduled  Discharged from clinic

## 2014-09-08 NOTE — Progress Notes (Signed)
Please refer to the lab/ visit from today for additional documentation on labs drawn.  PT/INR,  drawn today. Patient without prior bruising, discoloration or other noted abnormalities to the draw site.  There was minimalbruising or bleeding noted post blood draw at the site.  Patient without complaints.

## 2014-09-08 NOTE — Progress Notes (Signed)
INR 1.1. Instructed to continue [name] injections. Coumadin 6 mg today then 3 mg qd. Check the INR Friday. Orders per NP S. Herro. Verbalized understanding.

## 2014-09-08 NOTE — Patient Instructions (Signed)
Clinic Discharge Note     Clinic Discharge Note: Treatment was administered as prescribed without adverse event, with physician oversight.;Universal precautions observed with treatment;Patient instructed to call office with any questions or problems.  Discharge Teaching Instructions.;Verify on MAR all medications due today have been administered and documented on.;Return appointment scheduled;Discharged from clinic.    Patients Receiving  Treatment:  Report to your physician's office immediately or call  [phone]:              1.  Fever of 100.5 or greater.                2.  Vomiting without relief after using anti-nausea/vomiting medicine.                3.  Severe abdominal cramping/diarrhea, 3-5 watery bowel movements in a day.                4.  Pain at the IV site where you received your chemotherapy.                5.  Unusual or excessive bleeding from your mouth, nose, rectum, or vagina;  blood in your urine.                6.  A sudden onset of shortness of breath or chest pain.    Report to your physician's office within 24 hours:              1.  Pain not relieved by your usual pain medication.                2.  Signs of infection; redness, warmth, or swelling.                3.  Unusual bruising of any body area.                4.  Change in breathing patterns such as difficulty breathing or shortness of breath when you are resting.                5.  Change in urination; cloudiness, odor, frequency, or pain on urination.    Report to your physician's office within 48 hours:              1.  Skin changes: rash, hives, itching, redness and peeling of the skin.                2.  Numbness, tingling in the extremities or a persistent ringing in the ears.    Practice good personal hygiene.  If possible, avoid persons with colds or flu-like symptoms.  Drink plenty of fluids while awake and avoid exposure to the sun.  It is not unusual to experience other side effects, such as loss of appetite, altered  taste sensation or depression.

## 2014-09-09 ENCOUNTER — Encounter: Admit: 2014-09-09 | Discharge: 2014-09-09 | Payer: MEDICARE

## 2014-09-09 DIAGNOSIS — Z7901 Long term (current) use of anticoagulants: Secondary | ICD-10-CM

## 2014-09-09 MED ORDER — FONDAPARINUX SODIUM 7.5 MG/0.6ML SC SOLN
7.5 MG/0.6ML | Freq: Every day | SUBCUTANEOUS | Status: DC
Start: 2014-09-09 — End: 2014-09-09
  Administered 2014-09-09: 17:00:00 7.5 mg via SUBCUTANEOUS

## 2014-09-09 MED FILL — FONDAPARINUX SODIUM 7.5 MG/0.6ML SC SOLN: 7.5 MG/0.6ML | SUBCUTANEOUS | Qty: 0.6

## 2014-09-09 NOTE — Patient Instructions (Signed)
Clinic Discharge Note     Clinic Discharge Note: Treatment was administered as prescribed without adverse event, with physician oversight.;Universal precautions observed with treatment;Patient instructed to call office with any questions or problems.  Discharge Teaching Instructions.;Verify on MAR all medications due today have been administered and documented on.;Return appointment scheduled;Discharged from clinic.    Patients Receiving  Treatment:  Report to your physician's office immediately or call  [phone]:              1.  Fever of 100.5 or greater.                2.  Vomiting without relief after using anti-nausea/vomiting medicine.                3.  Severe abdominal cramping/diarrhea, 3-5 watery bowel movements in a day.                4.  Pain at the IV site where you received your chemotherapy.                5.  Unusual or excessive bleeding from your mouth, nose, rectum, or vagina;  blood in your urine.                6.  A sudden onset of shortness of breath or chest pain.    Report to your physician's office within 24 hours:              1.  Pain not relieved by your usual pain medication.                2.  Signs of infection; redness, warmth, or swelling.                3.  Unusual bruising of any body area.                4.  Change in breathing patterns such as difficulty breathing or shortness of breath when you are resting.                5.  Change in urination; cloudiness, odor, frequency, or pain on urination.    Report to your physician's office within 48 hours:              1.  Skin changes: rash, hives, itching, redness and peeling of the skin.                2.  Numbness, tingling in the extremities or a persistent ringing in the ears.    Practice good personal hygiene.  If possible, avoid persons with colds or flu-like symptoms.  Drink plenty of fluids while awake and avoid exposure to the sun.  It is not unusual to experience other side effects, such as loss of appetite, altered  taste sensation or depression.

## 2014-09-09 NOTE — Progress Notes (Signed)
Discharge Checklist  Clinic discharge accompanied by Self    Mode of transportation: Ambulatory    Clinic Discharge Note:  Treatment was admistered as prescribed without adverse event, with physician oversight.  Universal Precautions observed with treatment.  Documented problems discussed with MD.  Patient instructed to call office with any questions or problems. Discharge Teaching instruction  Patient Physical Status adequate  Verify on flowchart all medications due today have been administered and documented.  Return appointment scheduled  Discharged from clinic     Here for arixtra only. Pt to have injection 3/31 and INR 4/1

## 2014-09-10 ENCOUNTER — Encounter: Admit: 2014-09-10 | Discharge: 2014-09-10 | Payer: MEDICARE

## 2014-09-10 DIAGNOSIS — Z7901 Long term (current) use of anticoagulants: Secondary | ICD-10-CM

## 2014-09-10 MED ORDER — FONDAPARINUX SODIUM 7.5 MG/0.6ML SC SOLN
7.5 MG/0.6ML | Freq: Every day | SUBCUTANEOUS | Status: DC
Start: 2014-09-10 — End: 2014-09-10
  Administered 2014-09-10: 17:00:00 7.5 mg via SUBCUTANEOUS

## 2014-09-10 MED FILL — FONDAPARINUX SODIUM 7.5 MG/0.6ML SC SOLN: 7.5 MG/0.6ML | SUBCUTANEOUS | Qty: 0.6

## 2014-09-10 NOTE — Progress Notes (Signed)
09/01/14- Consent signed for [name]. LAS    08/26/14 [name] I26.99 approved per no precert required meets FDA. Benefits verified pot good 08/19/14- 11/26/14 anw    Injection given without problem or concern.  Labs not drawn per request of patient and also with order clarification.  RLQ at 1325 SQ.  Band aid applied.  Patient [facility]  Discharge Checklist  Clinic discharge accompanied by Self    Mode of transportation: Ambulatory    Clinic Discharge Note:  Treatment was admistered as prescribed without adverse event, with physician oversight.  Universal Precautions observed with treatment.  Patient instructed to call office with any questions or problems. Discharge Teaching instruction  Patient Physical Status adequate  Verify on flowchart all medications due today have been administered and documented.  Return appointment scheduled  Discharged from clinic

## 2014-09-10 NOTE — Patient Instructions (Signed)
Clinic Discharge Note     Clinic Discharge Note: Treatment was administered as prescribed without adverse event, with physician oversight.;Universal precautions observed with treatment;Patient instructed to call office with any questions or problems.  Discharge Teaching Instructions.;Verify on MAR all medications due today have been administered and documented on.;Return appointment scheduled;Discharged from clinic.    Patients Receiving  Treatment:  Report to your physician's office immediately or call  [phone]:              1.  Fever of 100.5 or greater.                2.  Vomiting without relief after using anti-nausea/vomiting medicine.                3.  Severe abdominal cramping/diarrhea, 3-5 watery bowel movements in a day.                4.  Pain at the IV site where you received your chemotherapy.                5.  Unusual or excessive bleeding from your mouth, nose, rectum, or vagina;  blood in your urine.                6.  A sudden onset of shortness of breath or chest pain.    Report to your physician's office within 24 hours:              1.  Pain not relieved by your usual pain medication.                2.  Signs of infection; redness, warmth, or swelling.                3.  Unusual bruising of any body area.                4.  Change in breathing patterns such as difficulty breathing or shortness of breath when you are resting.                5.  Change in urination; cloudiness, odor, frequency, or pain on urination.    Report to your physician's office within 48 hours:              1.  Skin changes: rash, hives, itching, redness and peeling of the skin.                2.  Numbness, tingling in the extremities or a persistent ringing in the ears.    Practice good personal hygiene.  If possible, avoid persons with colds or flu-like symptoms.  Drink plenty of fluids while awake and avoid exposure to the sun.  It is not unusual to experience other side effects, such as loss of appetite, altered  taste sensation or depression.

## 2014-09-11 ENCOUNTER — Encounter: Admit: 2014-09-11 | Discharge: 2014-09-11 | Payer: MEDICARE

## 2014-09-11 ENCOUNTER — Ambulatory Visit: Payer: MEDICARE | Attending: Hematology & Oncology

## 2014-09-11 DIAGNOSIS — Z7901 Long term (current) use of anticoagulants: Secondary | ICD-10-CM

## 2014-09-11 LAB — PROTIME-INR: INR: 2.6 — ABNORMAL HIGH (ref 0.8–1.2)

## 2014-09-11 MED ORDER — FONDAPARINUX SODIUM 7.5 MG/0.6ML SC SOLN
7.5 MG/0.6ML | Freq: Every day | SUBCUTANEOUS | Status: DC
Start: 2014-09-11 — End: 2014-09-11
  Administered 2014-09-11: 19:00:00 7.5 mg via SUBCUTANEOUS

## 2014-09-11 MED FILL — FONDAPARINUX SODIUM 7.5 MG/0.6ML SC SOLN: 7.5 MG/0.6ML | SUBCUTANEOUS | Qty: 0.6

## 2014-09-11 NOTE — Progress Notes (Signed)
Discharge Checklist  Clinic discharge accompanied by Self    Mode of transportation: Ambulatory    Clinic Discharge Note:  Treatment was admistered as prescribed without adverse event, with physician oversight.  Universal Precautions observed with treatment.  Documented problems discussed with MD.  Patient instructed to call office with any questions or problems. Discharge Teaching instruction  Patient Physical Status adequate  Verify on flowchart all medications due today have been administered and documented.  Return appointment scheduled  Discharged from clinic

## 2014-09-11 NOTE — Progress Notes (Signed)
Please refer to the lab/ visit from today for additional documentation on labs drawn.  PT/INR,  drawn today. Patient without prior bruising, discoloration or other noted abnormalities to the draw site.  There was minimalbruising or bleeding noted post blood draw at the site.  Patient without complaints.

## 2014-09-11 NOTE — Patient Instructions (Signed)
Clinic Discharge Note     Clinic Discharge Note: Treatment was administered as prescribed without adverse event, with physician oversight.;Universal precautions observed with treatment;Patient instructed to call office with any questions or problems.  Discharge Teaching Instructions.;Verify on MAR all medications due today have been administered and documented on.;Return appointment scheduled;Discharged from clinic.    Patients Receiving  Treatment:  Report to your physician's office immediately or call  [phone]:              1.  Fever of 100.5 or greater.                2.  Vomiting without relief after using anti-nausea/vomiting medicine.                3.  Severe abdominal cramping/diarrhea, 3-5 watery bowel movements in a day.                4.  Pain at the IV site where you received your chemotherapy.                5.  Unusual or excessive bleeding from your mouth, nose, rectum, or vagina;  blood in your urine.                6.  A sudden onset of shortness of breath or chest pain.    Report to your physician's office within 24 hours:              1.  Pain not relieved by your usual pain medication.                2.  Signs of infection; redness, warmth, or swelling.                3.  Unusual bruising of any body area.                4.  Change in breathing patterns such as difficulty breathing or shortness of breath when you are resting.                5.  Change in urination; cloudiness, odor, frequency, or pain on urination.    Report to your physician's office within 48 hours:              1.  Skin changes: rash, hives, itching, redness and peeling of the skin.                2.  Numbness, tingling in the extremities or a persistent ringing in the ears.    Practice good personal hygiene.  If possible, avoid persons with colds or flu-like symptoms.  Drink plenty of fluids while awake and avoid exposure to the sun.  It is not unusual to experience other side effects, such as loss of appetite, altered  taste sensation or depression.

## 2014-09-11 NOTE — Progress Notes (Signed)
INR2.6. Patient instructed to continue coumadin 3 mg qd. [name] injection today then discontinue. Check the INR in 1 week. Orders per NP S. Herro.

## 2014-09-11 NOTE — Progress Notes (Signed)
See anticoag

## 2014-09-21 ENCOUNTER — Ambulatory Visit: Admit: 2014-09-21 | Discharge: 2014-09-21 | Payer: MEDICARE | Attending: Hematology & Oncology

## 2014-09-21 DIAGNOSIS — I2699 Other pulmonary embolism without acute cor pulmonale: Secondary | ICD-10-CM

## 2014-09-21 LAB — PROTIME-INR: INR: 3.3 — ABNORMAL HIGH (ref 0.8–1.2)

## 2014-09-21 MED ORDER — WARFARIN SODIUM 6 MG PO TABS
6 MG | ORAL_TABLET | ORAL | Status: DC
Start: 2014-09-21 — End: 2015-01-19

## 2014-09-21 NOTE — Progress Notes (Signed)
Please refer to the lab/MD visit from today for additional documentation on labs drawn.  PT/INR,  drawn today. Patient without prior bruising, discoloration or other noted abnormalities to the draw site.  There was minimalbruising or bleeding noted post blood draw at the site.  Patient without complaints.

## 2014-09-21 NOTE — Progress Notes (Signed)
HEMATOLOGY FOLLOW-UP:       Primary Hematologist:  Pattricia BossPrasad R Marguerite Jarboe     DIAGNOSIS:      1. Recurrent pulmonary embolisms.    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    TREATMENTS GIVEN:         CURRENT TREATMENT:     Jantoven    Could not afford Xarelto, so she is back on Warfarin    OTHER MEDICAL PROBLEMS:     1. RA  2. HTN  3. Hyperlipidemia    INTERVAL HISTORY:       Patient returns to office for f/u  Doing well.       No headaches, Dizziness  No mouth sores,   No CP, SOB, Cough  No nausea. Anorexia +. No constipation, diarrhea.  No urinary frequency.  No leg swelling  No tingling, numbness in extremities.  No skin rashes.  No fever, chills, night sweats.      PHYSICAL EXAM:       BP 108/64 mmHg   Pulse 68   Temp(Src) 97.7 ??F (36.5 ??C) (Oral)   Resp 16   Ht 5\' 5"  (1.651 m)   Wt 179 lb 6.4 oz (81.375 kg)   BMI 29.85 kg/m2 Performance Status 0     General appearance: alert and cooperative  Head: Normocephalic, without obvious abnormality, atraumatic  Eyes: No Pallor, icterus  Oral mucosa: No mucositis, thrush  Neck: Supple, No fullness  Lungs: Clear to auscultation bilaterally, no audible rales, wheezes or crackles. Respiratory efforts normal.  Heart: Regular rate and rhythm, S1, S2 normal  Abdomen: Soft, non-tender; bowel sounds normal; no masses,  no organomegaly. Not distended.  Extremities: without cyanosis, clubbing, edema or asymmetry  Skin: No jaundice, purpura or petechiae  Neuro: No focal deficits, cranial nerve palsies      LABS:     CBC:   No results found for: WBC    BMP:   No results found for: NA    HEPATIC:  No results found for: AST    TUMOR MARKERS:   No results found for: PSA      IMAGING:         ASSESSMENT AND PLAN:       1.  Patient with recurrent pulmonary embolisms.  The patient is currently on Jantoven and is tolerating it well.  It will be adjusted to maintain INR between 3 and 3.5.    2. Homocysteinemia: Foltx will be continued.    [facility] were  reviewed.    F/U IN 1 YEAR    Pattricia BossPrasad R Kendryck Lacroix, MD       cc:  Raeanne BarryJoel Reginelli, M.D.  Zachery DauerBerthold Pembaur, M.D.  Nira ConnSri Koneru, M.D.

## 2014-09-21 NOTE — Progress Notes (Signed)
INR 3.3. Patient instructed by Dr Arbie CookeyKudalkar to continue coumadin 3 mg qd and check the INR in 4 weeks.

## 2014-10-19 ENCOUNTER — Encounter: Admit: 2014-10-19 | Discharge: 2014-10-19 | Payer: MEDICARE

## 2014-10-19 ENCOUNTER — Ambulatory Visit: Payer: MEDICARE | Attending: Hematology & Oncology

## 2014-10-19 DIAGNOSIS — Z7901 Long term (current) use of anticoagulants: Secondary | ICD-10-CM

## 2014-10-19 LAB — PROTIME-INR: INR: 3 — ABNORMAL HIGH (ref 0.8–1.2)

## 2014-10-19 NOTE — Progress Notes (Signed)
Please refer to the lab/ visit from today for additional documentation on labs drawn.  PT/INR,  drawn today. Patient without prior bruising, discoloration or other noted abnormalities to the draw site.  There was minimalbruising or bleeding noted post blood draw at the site.  Patient without complaints.

## 2014-10-19 NOTE — Progress Notes (Signed)
INR 3.0. Instructed to continue coumadin 3 mg qd. Check the INR in 4 weeks. Verbalized understanding.

## 2014-11-17 ENCOUNTER — Encounter: Admit: 2014-11-17 | Discharge: 2014-11-17 | Payer: MEDICARE

## 2014-11-17 ENCOUNTER — Ambulatory Visit: Payer: MEDICARE | Attending: Hematology & Oncology

## 2014-11-17 DIAGNOSIS — Z7901 Long term (current) use of anticoagulants: Secondary | ICD-10-CM

## 2014-11-17 LAB — PROTIME-INR: INR: 3.5 — ABNORMAL HIGH (ref 0.8–1.2)

## 2014-11-17 NOTE — Progress Notes (Signed)
INR 3.5. Continue coumadin 3 mg qd. Check the INR in 4 weeks. Verbalized understanding.

## 2014-11-17 NOTE — Progress Notes (Signed)
Please refer to the lab/ visit from today for additional documentation on labs drawn.  PT/INR,  drawn today. Patient without prior bruising, discoloration or other noted abnormalities to the draw site.  There was minimalbruising or bleeding noted post blood draw at the site.  Patient without complaints.

## 2014-12-15 ENCOUNTER — Encounter: Admit: 2014-12-15 | Discharge: 2014-12-15 | Payer: MEDICARE

## 2014-12-15 ENCOUNTER — Ambulatory Visit: Payer: MEDICARE | Attending: Hematology & Oncology

## 2014-12-15 DIAGNOSIS — Z7901 Long term (current) use of anticoagulants: Secondary | ICD-10-CM

## 2014-12-15 LAB — PROTIME-INR: INR: 2.8 — ABNORMAL HIGH (ref 0.8–1.2)

## 2014-12-15 NOTE — Progress Notes (Signed)
INR 2.8. Instructed to continue coumadin 3 mg qd. Check the INR in 2 weeks. Verbalized understanding.

## 2014-12-15 NOTE — Progress Notes (Signed)
Please refer to the lab visit from today for additional documentation on labs drawn.  PT/INR,  drawn today. Patient without prior bruising, discoloration or other noted abnormalities to the draw site.  There was minimalbruising or bleeding noted post blood draw at the site.  Patient without complaints.

## 2014-12-29 ENCOUNTER — Ambulatory Visit: Payer: MEDICARE | Attending: Hematology & Oncology

## 2014-12-29 ENCOUNTER — Encounter: Admit: 2014-12-29 | Discharge: 2014-12-29 | Payer: MEDICARE

## 2014-12-29 DIAGNOSIS — Z7901 Long term (current) use of anticoagulants: Secondary | ICD-10-CM

## 2014-12-29 LAB — PROTIME-INR: INR: 3 — ABNORMAL HIGH (ref 0.8–1.2)

## 2014-12-29 NOTE — Progress Notes (Signed)
INR 3.0. Instructed to continue coumadin 3 mg qd. Check the iNR in 4 weeks. Verbalized understanding.

## 2014-12-29 NOTE — Progress Notes (Signed)
Please refer to the lab/ visit from today for additional documentation on labs drawn.  PT/INR,  drawn today. Patient without prior bruising, discoloration or other noted abnormalities to the draw site.  There was minimalbruising or bleeding noted post blood draw at the site.  Patient without complaints.

## 2015-01-11 NOTE — Telephone Encounter (Signed)
Patient is having a SI injection 01/22/15. She is aware to hold coumadin starting 01/16/15. She will [name] and [facility]. INR 01/26/15. She will restart coumadin 01/23/15.

## 2015-01-11 NOTE — Telephone Encounter (Signed)
error 

## 2015-01-11 NOTE — Telephone Encounter (Signed)
Patient called regarding Questions about an appointment; waiting on the nurse to call back to set up her injections. She would like this done today.. Patient may be reached at [phone].

## 2015-01-11 NOTE — Telephone Encounter (Signed)
Patient called to request a series of the Lovenox injections and her SI is scheduled 8.12.16 at 730am. And also to start a series of the injections on 8.13, 14, 15 as well. Please call back.

## 2015-01-18 ENCOUNTER — Encounter: Admit: 2015-01-18 | Discharge: 2015-01-18 | Payer: MEDICARE

## 2015-01-18 DIAGNOSIS — Z7901 Long term (current) use of anticoagulants: Secondary | ICD-10-CM

## 2015-01-18 MED ORDER — FONDAPARINUX SODIUM 7.5 MG/0.6ML SC SOLN
7.5 MG/0.6ML | Freq: Every day | SUBCUTANEOUS | Status: DC
Start: 2015-01-18 — End: 2015-01-18
  Administered 2015-01-18: 14:00:00 7.5 mg via SUBCUTANEOUS

## 2015-01-18 MED FILL — FONDAPARINUX SODIUM 7.5 MG/0.6ML SC SOLN: 7.5 MG/0.6ML | SUBCUTANEOUS | Qty: 0.6

## 2015-01-18 NOTE — Progress Notes (Signed)
01/13/15 [name] I26.99 approved per no precert required meets FDA. Benefits verified pot good 01/13/15-04/15/15.ajh    [name] administered SQ.     Discharge Checklist  Clinic discharge accompanied by Self    Mode of transportation: Ambulatory    Clinic Discharge Note:  Treatment was admistered as prescribed without adverse event, with physician oversight.  Universal Precautions observed with treatment.  Patient Physical Status adequate  Verify on flowchart all medications due today have been administered and documented.  Return appointment scheduled  Discharged from clinic

## 2015-01-18 NOTE — Patient Instructions (Signed)
Clinic Discharge Note     Clinic Discharge Note: Treatment was administered as prescribed without adverse event, with physician oversight.;Universal precautions observed with treatment;Patient instructed to call office with any questions or problems.  Discharge Teaching Instructions.;Verify on MAR all medications due today have been administered and documented on.;Return appointment scheduled;Discharged from clinic.    Patients Receiving  Treatment:  Report to your physician's office immediately or call  [phone]:              1.  Fever of 100.5 or greater.                2.  Vomiting without relief after using anti-nausea/vomiting medicine.                3.  Severe abdominal cramping/diarrhea, 3-5 watery bowel movements in a day.                4.  Pain at the IV site where you received your chemotherapy.                5.  Unusual or excessive bleeding from your mouth, nose, rectum, or vagina;  blood in your urine.                6.  A sudden onset of shortness of breath or chest pain.    Report to your physician's office within 24 hours:              1.  Pain not relieved by your usual pain medication.                2.  Signs of infection; redness, warmth, or swelling.                3.  Unusual bruising of any body area.                4.  Change in breathing patterns such as difficulty breathing or shortness of breath when you are resting.                5.  Change in urination; cloudiness, odor, frequency, or pain on urination.    Report to your physician's office within 48 hours:              1.  Skin changes: rash, hives, itching, redness and peeling of the skin.                2.  Numbness, tingling in the extremities or a persistent ringing in the ears.    Practice good personal hygiene.  If possible, avoid persons with colds or flu-like symptoms.  Drink plenty of fluids while awake and avoid exposure to the sun.  It is not unusual to experience other side effects, such as loss of appetite, altered  taste sensation or depression.

## 2015-01-19 ENCOUNTER — Encounter: Admit: 2015-01-19 | Discharge: 2015-01-19 | Payer: MEDICARE

## 2015-01-19 DIAGNOSIS — Z7901 Long term (current) use of anticoagulants: Secondary | ICD-10-CM

## 2015-01-19 MED ORDER — FONDAPARINUX SODIUM 7.5 MG/0.6ML SC SOLN
7.5 MG/0.6ML | Freq: Every day | SUBCUTANEOUS | Status: DC
Start: 2015-01-19 — End: 2015-01-19
  Administered 2015-01-19: 14:00:00 7.5 mg via SUBCUTANEOUS

## 2015-01-19 MED FILL — FONDAPARINUX SODIUM 7.5 MG/0.6ML SC SOLN: 7.5 MG/0.6ML | SUBCUTANEOUS | Qty: 0.6

## 2015-01-19 NOTE — Progress Notes (Signed)
Pt here for Axtria today for procedure on Friday  Patient is having a SI injection 01/22/15. She is aware to hold coumadin starting 01/16/15. She [name]/15 and [facility]/15. [facility]. INR 01/26/15. She will restart coumadin 01/23/15.  Discussed with the patient and all questioned fully answered. She will call me if any problems arise.   Discharge Checklist  Clinic discharge accompanied by Self    Mode of transportation: Ambulatory    Clinic Discharge Note:  Treatment was admistered as prescribed without adverse event, with physician oversight.  Universal Precautions observed with treatment.  Documented problems discussed with MD.  Patient instructed to call office with any questions or problems. Discharge Teaching instruction  Patient Physical Status adequate  Verify on flowchart all medications due today have been administered and documented.  Return appointment scheduled  Discharged from clinic

## 2015-01-19 NOTE — Patient Instructions (Signed)
Clinic Discharge Note     Clinic Discharge Note: Treatment was administered as prescribed without adverse event, with physician oversight.;Universal precautions observed with treatment;Patient instructed to call office with any questions or problems.  Discharge Teaching Instructions.;Verify on MAR all medications due today have been administered and documented on.;Return appointment scheduled;Discharged from clinic.    Patients Receiving  Treatment:  Report to your physician's office immediately or call  [phone]:              1.  Fever of 100.5 or greater.                2.  Vomiting without relief after using anti-nausea/vomiting medicine.                3.  Severe abdominal cramping/diarrhea, 3-5 watery bowel movements in a day.                4.  Pain at the IV site where you received your chemotherapy.                5.  Unusual or excessive bleeding from your mouth, nose, rectum, or vagina;  blood in your urine.                6.  A sudden onset of shortness of breath or chest pain.    Report to your physician's office within 24 hours:              1.  Pain not relieved by your usual pain medication.                2.  Signs of infection; redness, warmth, or swelling.                3.  Unusual bruising of any body area.                4.  Change in breathing patterns such as difficulty breathing or shortness of breath when you are resting.                5.  Change in urination; cloudiness, odor, frequency, or pain on urination.    Report to your physician's office within 48 hours:              1.  Skin changes: rash, hives, itching, redness and peeling of the skin.                2.  Numbness, tingling in the extremities or a persistent ringing in the ears.    Practice good personal hygiene.  If possible, avoid persons with colds or flu-like symptoms.  Drink plenty of fluids while awake and avoid exposure to the sun.  It is not unusual to experience other side effects, such as loss of appetite, altered  taste sensation or depression.

## 2015-01-20 ENCOUNTER — Encounter: Admit: 2015-01-20 | Discharge: 2015-01-20 | Payer: MEDICARE

## 2015-01-20 DIAGNOSIS — Z7901 Long term (current) use of anticoagulants: Secondary | ICD-10-CM

## 2015-01-20 MED ORDER — FONDAPARINUX SODIUM 7.5 MG/0.6ML SC SOLN
7.5 MG/0.6ML | Freq: Every day | SUBCUTANEOUS | Status: DC
Start: 2015-01-20 — End: 2015-01-20
  Administered 2015-01-20: 14:00:00 7.5 mg via SUBCUTANEOUS

## 2015-01-20 MED FILL — FONDAPARINUX SODIUM 7.5 MG/0.6ML SC SOLN: 7.5 MG/0.6ML | SUBCUTANEOUS | Qty: 0.6

## 2015-01-20 NOTE — Progress Notes (Signed)
Discharge Checklist  Clinic discharge accompanied by Self    Mode of transportation: Ambulatory    Clinic Discharge Note:  Treatment was admistered as prescribed without adverse event, with physician oversight.  Universal Precautions observed with treatment.  Documented problems discussed with MD.  Patient instructed to call office with any questions or problems. Discharge Teaching instruction  Patient Physical Status adequate  Verify on flowchart all medications due today have been administered and documented.  Return appointment scheduled  Discharged from clinic     Here for arixtra. No INR today. Pt to have procedure on 8-12, no injection on 8-11.   pt to rto on Saturday and Sunday [facility]  Hospital for arixtra injections. Pt to return to [facility] office on Monday and Tuesday for arixtra.

## 2015-01-20 NOTE — Patient Instructions (Signed)
Clinic Discharge Note     Clinic Discharge Note: Treatment was administered as prescribed without adverse event, with physician oversight.;Universal precautions observed with treatment;Patient instructed to call office with any questions or problems.  Discharge Teaching Instructions.;Verify on MAR all medications due today have been administered and documented on.;Return appointment scheduled;Discharged from clinic.    Patients Receiving  Treatment:  Report to your physician's office immediately or call  [phone]:              1.  Fever of 100.5 or greater.                2.  Vomiting without relief after using anti-nausea/vomiting medicine.                3.  Severe abdominal cramping/diarrhea, 3-5 watery bowel movements in a day.                4.  Pain at the IV site where you received your chemotherapy.                5.  Unusual or excessive bleeding from your mouth, nose, rectum, or vagina;  blood in your urine.                6.  A sudden onset of shortness of breath or chest pain.    Report to your physician's office within 24 hours:              1.  Pain not relieved by your usual pain medication.                2.  Signs of infection; redness, warmth, or swelling.                3.  Unusual bruising of any body area.                4.  Change in breathing patterns such as difficulty breathing or shortness of breath when you are resting.                5.  Change in urination; cloudiness, odor, frequency, or pain on urination.    Report to your physician's office within 48 hours:              1.  Skin changes: rash, hives, itching, redness and peeling of the skin.                2.  Numbness, tingling in the extremities or a persistent ringing in the ears.    Practice good personal hygiene.  If possible, avoid persons with colds or flu-like symptoms.  Drink plenty of fluids while awake and avoid exposure to the sun.  It is not unusual to experience other side effects, such as loss of appetite, altered  taste sensation or depression.

## 2015-01-23 ENCOUNTER — Encounter: Admit: 2015-01-23 | Payer: MEDICARE

## 2015-01-23 DIAGNOSIS — Z7901 Long term (current) use of anticoagulants: Secondary | ICD-10-CM

## 2015-01-23 MED ORDER — FONDAPARINUX SODIUM 7.5 MG/0.6ML SC SOLN
7.5 MG/0.6ML | Freq: Every day | SUBCUTANEOUS | Status: DC
Start: 2015-01-23 — End: 2015-01-23
  Administered 2015-01-23: 14:00:00 7.5 mg via SUBCUTANEOUS

## 2015-01-23 MED FILL — FONDAPARINUX SODIUM 7.5 MG/0.6ML SC SOLN: 7.5 MG/0.6ML | SUBCUTANEOUS | Qty: 0.6

## 2015-01-23 NOTE — Patient Instructions (Signed)
Clinic Discharge Note     Clinic Discharge Note: Treatment was administered as prescribed without adverse event, with physician oversight.;Universal precautions observed with treatment;Patient instructed to call office with any questions or problems.  Discharge Teaching Instructions.;Verify on MAR all medications due today have been administered and documented on.;Return appointment scheduled;Discharged from clinic.    Patients Receiving  Treatment:  Report to your physician's office immediately or call  [phone]:              1.  Fever of 100.5 or greater.                2.  Vomiting without relief after using anti-nausea/vomiting medicine.                3.  Severe abdominal cramping/diarrhea, 3-5 watery bowel movements in a day.                4.  Pain at the IV site where you received your chemotherapy.                5.  Unusual or excessive bleeding from your mouth, nose, rectum, or vagina;  blood in your urine.                6.  A sudden onset of shortness of breath or chest pain.    Report to your physician's office within 24 hours:              1.  Pain not relieved by your usual pain medication.                2.  Signs of infection; redness, warmth, or swelling.                3.  Unusual bruising of any body area.                4.  Change in breathing patterns such as difficulty breathing or shortness of breath when you are resting.                5.  Change in urination; cloudiness, odor, frequency, or pain on urination.    Report to your physician's office within 48 hours:              1.  Skin changes: rash, hives, itching, redness and peeling of the skin.                2.  Numbness, tingling in the extremities or a persistent ringing in the ears.    Practice good personal hygiene.  If possible, avoid persons with colds or flu-like symptoms.  Drink plenty of fluids while awake and avoid exposure to the sun.  It is not unusual to experience other side effects, such as loss of appetite, altered  taste sensation or depression.

## 2015-01-23 NOTE — Progress Notes (Signed)
1000:  [name] given subcutaneously to LLQ.     1003:  Discharge Checklist  Clinic discharge accompanied by Self    Mode of transportation: Ambulatory    Clinic Discharge Note:  Treatment was admistered as prescribed without adverse event, with physician oversight.  Universal Precautions observed with treatment.  Patient instructed to call office with any questions or problems. Discharge Teaching instruction  Patient Physical Status adequate  Verify on flowchart all medications due today have been administered and documented.  Return appointment scheduled  Discharged from clinic

## 2015-01-24 ENCOUNTER — Encounter: Admit: 2015-01-24 | Payer: MEDICARE

## 2015-01-24 DIAGNOSIS — Z7901 Long term (current) use of anticoagulants: Secondary | ICD-10-CM

## 2015-01-24 MED ORDER — FONDAPARINUX SODIUM 7.5 MG/0.6ML SC SOLN
7.5 MG/0.6ML | Freq: Every day | SUBCUTANEOUS | Status: DC
Start: 2015-01-24 — End: 2015-01-24
  Administered 2015-01-24: 14:00:00 7.5 mg via SUBCUTANEOUS

## 2015-01-24 MED FILL — FONDAPARINUX SODIUM 7.5 MG/0.6ML SC SOLN: 7.5 MG/0.6ML | SUBCUTANEOUS | Qty: 0.6

## 2015-01-24 NOTE — Progress Notes (Signed)
Pt has no complaints. [name] given per order, tolerated injection with no complaints.

## 2015-01-25 ENCOUNTER — Encounter: Admit: 2015-01-25 | Discharge: 2015-01-25 | Payer: MEDICARE

## 2015-01-25 DIAGNOSIS — Z7901 Long term (current) use of anticoagulants: Secondary | ICD-10-CM

## 2015-01-25 MED ORDER — FONDAPARINUX SODIUM 7.5 MG/0.6ML SC SOLN
7.5 MG/0.6ML | Freq: Every day | SUBCUTANEOUS | Status: AC
Start: 2015-01-25 — End: ?
  Administered 2015-01-25: 14:00:00 7.5 mg via SUBCUTANEOUS

## 2015-01-25 MED FILL — FONDAPARINUX SODIUM 7.5 MG/0.6ML SC SOLN: 7.5 MG/0.6ML | SUBCUTANEOUS | Qty: 0.6

## 2015-01-25 NOTE — Progress Notes (Signed)
Patient tolerated [name] injection into abdominal tissue without complaints. Voices understanding on follow up tomorrow for injection and INR.     Discharge Checklist  Clinic discharge accompanied by Self    Mode of transportation: Ambulatory    Clinic Discharge Note:  Treatment was admistered as prescribed without adverse event, with physician oversight.  Universal Precautions observed with treatment.  Patient instructed to call office with any questions or problems. Discharge Teaching instruction  Patient Physical Status adequate  Verify on flowchart all medications due today have been administered and documented.  Return appointment scheduled  Discharged from clinic   Catherine Brewer

## 2015-01-25 NOTE — Patient Instructions (Signed)
Clinic Discharge Note     Clinic Discharge Note: Treatment was administered as prescribed without adverse event, with physician oversight.;Universal precautions observed with treatment;Patient instructed to call office with any questions or problems.  Discharge Teaching Instructions.;Verify on MAR all medications due today have been administered and documented on.;Return appointment scheduled;Discharged from clinic.    Patients Receiving  Treatment:  Report to your physician's office immediately or call  [phone]:              1.  Fever of 100.5 or greater.                2.  Vomiting without relief after using anti-nausea/vomiting medicine.                3.  Severe abdominal cramping/diarrhea, 3-5 watery bowel movements in a day.                4.  Pain at the IV site where you received your chemotherapy.                5.  Unusual or excessive bleeding from your mouth, nose, rectum, or vagina;  blood in your urine.                6.  A sudden onset of shortness of breath or chest pain.    Report to your physician's office within 24 hours:              1.  Pain not relieved by your usual pain medication.                2.  Signs of infection; redness, warmth, or swelling.                3.  Unusual bruising of any body area.                4.  Change in breathing patterns such as difficulty breathing or shortness of breath when you are resting.                5.  Change in urination; cloudiness, odor, frequency, or pain on urination.    Report to your physician's office within 48 hours:              1.  Skin changes: rash, hives, itching, redness and peeling of the skin.                2.  Numbness, tingling in the extremities or a persistent ringing in the ears.    Practice good personal hygiene.  If possible, avoid persons with colds or flu-like symptoms.  Drink plenty of fluids while awake and avoid exposure to the sun.  It is not unusual to experience other side effects, such as loss of appetite, altered  taste sensation or depression.

## 2015-01-26 ENCOUNTER — Encounter: Admit: 2015-01-26 | Discharge: 2015-01-26 | Payer: MEDICARE

## 2015-01-26 DIAGNOSIS — R7983 Abnormal findings of blood amino-acid level: Secondary | ICD-10-CM

## 2015-01-26 DIAGNOSIS — Z7901 Long term (current) use of anticoagulants: Secondary | ICD-10-CM

## 2015-01-26 LAB — PROTIME-INR: INR: 1.2 (ref 0.8–1.2)

## 2015-01-26 MED ORDER — FONDAPARINUX SODIUM 7.5 MG/0.6ML SC SOLN
7.5 MG/0.6ML | Freq: Every day | SUBCUTANEOUS | Status: DC
Start: 2015-01-26 — End: 2015-01-26
  Administered 2015-01-26: 19:00:00 7.5 mg via SUBCUTANEOUS

## 2015-01-26 MED FILL — FONDAPARINUX SODIUM 7.5 MG/0.6ML SC SOLN: 7.5 MG/0.6ML | SUBCUTANEOUS | Qty: 0.6

## 2015-01-26 NOTE — Progress Notes (Signed)
Please refer to the lab  visit from today for additional documentation on labs drawn.  PT/INR, drawn today. Patient without prior bruising, discoloration or other noted abnormalities to the draw site.  There was minimalbruising or bleeding noted post blood draw at the site.  Patient without complaints.

## 2015-01-26 NOTE — Progress Notes (Signed)
Patient here for [name] injection today.  Pt's INR=1.2  1444:  [name] injection given in LLQ abdomen, pt tolerated well, denies complaints, site unremarkable.     Discharge Checklist  Clinic discharge accompanied by Self    Mode of transportation: Ambulatory    Clinic Discharge Note:  Treatment was admistered as prescribed without adverse event, with physician oversight.  Universal Precautions observed with treatment.  Patient instructed to call office with any questions or problems. Discharge Teaching instruction  Patient Physical Status adequate  Verify on flowchart all medications due today have been administered and documented.  Return appointment scheduled  Discharged from clinic at 1446.

## 2015-01-26 NOTE — Patient Instructions (Signed)
Clinic Discharge Note     Clinic Discharge Note: Treatment was administered as prescribed without adverse event, with physician oversight.;Universal precautions observed with treatment;Patient instructed to call office with any questions or problems.  Discharge Teaching Instructions.;Verify on MAR all medications due today have been administered and documented on.;Return appointment scheduled;Discharged from clinic.    Patients Receiving  Treatment:  Report to your physician's office immediately or call  [phone]:              1.  Fever of 100.5 or greater.                2.  Vomiting without relief after using anti-nausea/vomiting medicine.                3.  Severe abdominal cramping/diarrhea, 3-5 watery bowel movements in a day.                4.  Pain at the IV site where you received your chemotherapy.                5.  Unusual or excessive bleeding from your mouth, nose, rectum, or vagina;  blood in your urine.                6.  A sudden onset of shortness of breath or chest pain.    Report to your physician's office within 24 hours:              1.  Pain not relieved by your usual pain medication.                2.  Signs of infection; redness, warmth, or swelling.                3.  Unusual bruising of any body area.                4.  Change in breathing patterns such as difficulty breathing or shortness of breath when you are resting.                5.  Change in urination; cloudiness, odor, frequency, or pain on urination.    Report to your physician's office within 48 hours:              1.  Skin changes: rash, hives, itching, redness and peeling of the skin.                2.  Numbness, tingling in the extremities or a persistent ringing in the ears.    Practice good personal hygiene.  If possible, avoid persons with colds or flu-like symptoms.  Drink plenty of fluids while awake and avoid exposure to the sun.  It is not unusual to experience other side effects, such as loss of appetite, altered  taste sensation or depression.

## 2015-01-26 NOTE — Progress Notes (Signed)
INR 1.2. Instructed to increase coumadin to 6 mg qd. [name] toady and tomorrow. Check the INR Friday. Orders per Dr Arbie CookeyKudalkar. Verbalized understanding.

## 2015-01-27 ENCOUNTER — Encounter: Admit: 2015-01-27 | Discharge: 2015-01-27 | Payer: MEDICARE

## 2015-01-27 DIAGNOSIS — Z7901 Long term (current) use of anticoagulants: Secondary | ICD-10-CM

## 2015-01-27 MED ORDER — FONDAPARINUX SODIUM 7.5 MG/0.6ML SC SOLN
7.5 MG/0.6ML | Freq: Every day | SUBCUTANEOUS | Status: DC
Start: 2015-01-27 — End: 2015-01-27
  Administered 2015-01-27: 18:00:00 7.5 mg via SUBCUTANEOUS

## 2015-01-27 MED FILL — FONDAPARINUX SODIUM 7.5 MG/0.6ML SC SOLN: 7.5 MG/0.6ML | SUBCUTANEOUS | Qty: 0.6

## 2015-01-27 NOTE — Patient Instructions (Signed)
Clinic Discharge Note     Clinic Discharge Note: Treatment was administered as prescribed without adverse event, with physician oversight.;Universal precautions observed with treatment;Patient instructed to call office with any questions or problems.  Discharge Teaching Instructions.;Verify on MAR all medications due today have been administered and documented on.;Return appointment scheduled;Discharged from clinic.    Patients Receiving  Treatment:  Report to your physician's office immediately or call  [phone]:              1.  Fever of 100.5 or greater.                2.  Vomiting without relief after using anti-nausea/vomiting medicine.                3.  Severe abdominal cramping/diarrhea, 3-5 watery bowel movements in a day.                4.  Pain at the IV site where you received your chemotherapy.                5.  Unusual or excessive bleeding from your mouth, nose, rectum, or vagina;  blood in your urine.                6.  A sudden onset of shortness of breath or chest pain.    Report to your physician's office within 24 hours:              1.  Pain not relieved by your usual pain medication.                2.  Signs of infection; redness, warmth, or swelling.                3.  Unusual bruising of any body area.                4.  Change in breathing patterns such as difficulty breathing or shortness of breath when you are resting.                5.  Change in urination; cloudiness, odor, frequency, or pain on urination.    Report to your physician's office within 48 hours:              1.  Skin changes: rash, hives, itching, redness and peeling of the skin.                2.  Numbness, tingling in the extremities or a persistent ringing in the ears.    Practice good personal hygiene.  If possible, avoid persons with colds or flu-like symptoms.  Drink plenty of fluids while awake and avoid exposure to the sun.  It is not unusual to experience other side effects, such as loss of appetite, altered  taste sensation or depression.

## 2015-01-27 NOTE — Progress Notes (Signed)
Discharge Checklist  Clinic discharge accompanied by Self    Mode of transportation: Ambulatory    Clinic Discharge Note:  Treatment was admistered as prescribed without adverse event, with physician oversight.  Universal Precautions observed with treatment.  Documented problems discussed with MD.  Patient instructed to call office with any questions or problems. Discharge Teaching instruction  Patient Physical Status adequate  Verify on flowchart all medications due today have been administered and documented.  Return appointment scheduled  Discharged from clinic     Here for arixtra, INR on Friday. No injection on Thursday. Pt noted small golf ball size bruise to front of right foot. States not sure how she did it. Pt denies any other complaints

## 2015-01-29 ENCOUNTER — Ambulatory Visit: Payer: MEDICARE | Attending: Hematology & Oncology

## 2015-01-29 ENCOUNTER — Encounter: Admit: 2015-01-29 | Discharge: 2015-01-29 | Payer: MEDICARE

## 2015-01-29 DIAGNOSIS — Z7901 Long term (current) use of anticoagulants: Secondary | ICD-10-CM

## 2015-01-29 LAB — PROTIME-INR: INR: 3.6 — ABNORMAL HIGH (ref 0.8–1.2)

## 2015-01-29 MED ORDER — FA-PYRIDOXINE-CYANOCOBALAMIN 2.5-25-2 MG PO TABS
ORAL_TABLET | ORAL | 5 refills | Status: AC
Start: 2015-01-29 — End: ?

## 2015-01-29 NOTE — Progress Notes (Signed)
INR 3.6. Instructed to go back to her regular dose of coumadin 3 mg qd. Stop the [name] injections. Check the INR in 2 weeks. Verbalized understanding.

## 2015-01-29 NOTE — Progress Notes (Signed)
Please refer to the lab/ visit from today for additional documentation on labs drawn.  PT/INR,  drawn today. Patient without prior bruising, discoloration or other noted abnormalities to the draw site.  There was minimalbruising or bleeding noted post blood draw at the site.  Patient without complaints.

## 2015-01-30 NOTE — Progress Notes (Signed)
Anticoagulation visit

## 2015-02-12 ENCOUNTER — Ambulatory Visit: Payer: MEDICARE | Attending: Hematology & Oncology

## 2015-02-12 ENCOUNTER — Encounter: Admit: 2015-02-12 | Discharge: 2015-02-12 | Payer: MEDICARE

## 2015-02-12 DIAGNOSIS — Z7901 Long term (current) use of anticoagulants: Secondary | ICD-10-CM

## 2015-02-12 LAB — PROTIME-INR: INR: 3 — ABNORMAL HIGH (ref 0.8–1.2)

## 2015-02-12 NOTE — Progress Notes (Signed)
Catherine Brewer's INR today is   INR   Date Value Ref Range Status   02/12/2015 3.0 (H) 0.8 - 1.2 Final     Comment:     Normal                       0.8 - 1.2  Low Anticoagulation          1.5 - 2.0  Moderate Anticoagulation     2.0 - 3.0  High Anticoagulation         2.5 - 4.0  [facility] [place] [facility] [address],[place],[phone],Dr. Loralee Pacas       Current Coumadin dose is  daily    Per MD, Dois Davenport instructed to continue  daily and RTC for next INR in One Month.    Preslei verbalized understanding.    The patient was seen today for evaluation and management of anticoagulation therapy. Under the direct supervision of Dr.Kudalkar  the plan will be as above. The medication list was reviewed today with the patient. All questions were answered to the patient's satisfaction. They will call with any questions or concerns.

## 2015-02-12 NOTE — Progress Notes (Signed)
Please refer to the lab/nurse visit from today for additional documentation on labs drawn.  PT/INR,drawn today. Patient without prior bruising, discoloration or other noted abnormalities to the draw site.  There was minimalbruising or bleeding noted post blood draw at the site.  Patient without complaints.

## 2015-03-10 ENCOUNTER — Ambulatory Visit: Payer: MEDICARE | Attending: Hematology & Oncology

## 2015-03-10 ENCOUNTER — Encounter: Admit: 2015-03-10 | Discharge: 2015-03-10 | Payer: MEDICARE

## 2015-03-10 DIAGNOSIS — Z7901 Long term (current) use of anticoagulants: Secondary | ICD-10-CM

## 2015-03-10 LAB — PROTIME-INR: INR: 3.5 — ABNORMAL HIGH (ref 0.8–1.2)

## 2015-03-10 NOTE — Progress Notes (Signed)
Please refer to the lab visit from today for additional documentation on labs drawn.  PT/INR,  drawn today. Patient without prior bruising, discoloration or other noted abnormalities to the draw site.  There was minimalbruising or bleeding noted post blood draw at the site.  Patient without complaints.

## 2015-03-10 NOTE — Progress Notes (Signed)
INR 3.5. Instructed to continue coumadin 3 mg qd. Check the INR in 4 weeks.

## 2015-03-23 NOTE — Telephone Encounter (Signed)
Patient called regarding Questions about Medication; Patient stated [facility]) medication is $55.00 this month and wants to know if there is a generic medication she can take instead. Patient may be reached at [phone].

## 2015-03-23 NOTE — Telephone Encounter (Signed)
Patient told per Dr Arbie Cookey to take Folic Acid 1 mg 1 tablet daily and B12 1000 mcg sublingual 1 tablet weekly. Verbalize understanding.

## 2015-03-23 NOTE — Telephone Encounter (Signed)
Sent message to Dr Kudalkar.

## 2015-04-07 ENCOUNTER — Ambulatory Visit: Payer: MEDICARE | Attending: Hematology & Oncology

## 2015-04-07 ENCOUNTER — Encounter: Admit: 2015-04-07 | Discharge: 2015-04-07 | Payer: MEDICARE

## 2015-04-07 DIAGNOSIS — Z7901 Long term (current) use of anticoagulants: Secondary | ICD-10-CM

## 2015-04-07 LAB — PROTIME-INR: INR: 3 — ABNORMAL HIGH (ref 0.8–1.2)

## 2015-04-07 NOTE — Progress Notes (Signed)
INR 3.0. Instructed to continue coumadin 3 mg qd. Check the INR in 4 weeks.

## 2015-04-07 NOTE — Progress Notes (Signed)
Please refer to the lab/ visit from today for additional documentation on labs drawn.  PT/INR,  drawn today. Patient without prior bruising, discoloration or other noted abnormalities to the draw site.  There was minimalbruising or bleeding noted post blood draw at the site.  Patient without complaints.

## 2015-04-12 NOTE — Telephone Encounter (Signed)
Noted.

## 2015-04-12 NOTE — Telephone Encounter (Signed)
Patient called to reschedule a Lab only appointment. Appointment has been rescheduled to 11.22.2016. Patient may be reached at [phone].

## 2015-05-04 ENCOUNTER — Encounter: Admit: 2015-05-04 | Discharge: 2015-05-04 | Payer: MEDICARE

## 2015-05-04 ENCOUNTER — Ambulatory Visit: Payer: MEDICARE | Attending: Hematology & Oncology

## 2015-05-04 DIAGNOSIS — Z7901 Long term (current) use of anticoagulants: Secondary | ICD-10-CM

## 2015-05-04 LAB — PROTIME-INR: INR: 3.1 — ABNORMAL HIGH (ref 0.8–1.2)

## 2015-05-04 NOTE — Progress Notes (Signed)
Please refer to the lab visit from today for additional documentation on labs drawn.  PT/INR drawn today. Patient without prior bruising, discoloration or other noted abnormalities to the draw site.  There was minimalbruising or bleeding noted post blood draw at the site.  Patient without complaints.

## 2015-05-04 NOTE — Progress Notes (Signed)
The patient was seen today for evaluation and management of anticoagulation. Under the direct supervision of Dr. Arbie CookeyKudalkar the plan will be to continue Coumadin 3 mg daily and recheck INR in four weeks. The medication list was reviewed today with the patient. All questions were answered to the patient's satisfaction. They will call with any questions or concerns.

## 2015-05-05 ENCOUNTER — Encounter

## 2015-05-07 ENCOUNTER — Emergency Department: Payer: Medicare Other

## 2015-05-07 ENCOUNTER — Emergency Department
Admission: EM | Admit: 2015-05-07 | Discharge: 2015-05-07 | Disposition: A | Discharge status: Home / Self Care | Payer: Medicare Other | Attending: Emergency Medicine | Admitting: Emergency Medicine

## 2015-05-07 ENCOUNTER — Encounter: Payer: Self-pay | Admitting: Emergency Medicine

## 2015-05-07 DIAGNOSIS — Z87891 Personal history of nicotine dependence: Secondary | ICD-10-CM | POA: Insufficient documentation

## 2015-05-07 DIAGNOSIS — R112 Nausea with vomiting, unspecified: Secondary | ICD-10-CM | POA: Diagnosis present

## 2015-05-07 DIAGNOSIS — I10 Essential (primary) hypertension: Secondary | ICD-10-CM | POA: Insufficient documentation

## 2015-05-07 DIAGNOSIS — R42 Dizziness and giddiness: Secondary | ICD-10-CM | POA: Diagnosis not present

## 2015-05-07 DIAGNOSIS — K529 Noninfective gastroenteritis and colitis, unspecified: Secondary | ICD-10-CM | POA: Insufficient documentation

## 2015-05-07 HISTORY — DX: Other pulmonary embolism without acute cor pulmonale: I26.99

## 2015-05-07 HISTORY — DX: Essential (primary) hypertension: I10

## 2015-05-07 HISTORY — DX: Unspecified osteoarthritis, unspecified site: M19.90

## 2015-05-07 LAB — COMPREHENSIVE METABOLIC PANEL
ALK PHOS: 68 U/L (ref 38–126)
ALT: 28 U/L (ref 14–54)
AST: 33 U/L (ref 15–41)
Albumin: 4.2 g/dL (ref 3.5–5.0)
Anion gap: 9 (ref 5–15)
BUN: 22 mg/dL — AB (ref 6–20)
CALCIUM: 9.1 mg/dL (ref 8.9–10.3)
CHLORIDE: 106 mmol/L (ref 101–111)
CO2: 24 mmol/L (ref 22–32)
CREATININE: 0.77 mg/dL (ref 0.44–1.00)
GFR calc Af Amer: >60 mL/min (ref >60)
GFR calc non Af Amer: >60 mL/min (ref >60)
Glucose, Bld: 140 mg/dL — ABNORMAL HIGH (ref 65–99)
Potassium: 3.4 mmol/L — ABNORMAL LOW (ref 3.5–5.1)
SODIUM: 139 mmol/L (ref 135–145)
Total Bilirubin: 0.9 mg/dL (ref 0.3–1.2)
Total Protein: 7.4 g/dL (ref 6.5–8.1)

## 2015-05-07 LAB — CBC WITH DIFFERENTIAL/PLATELET
BASOS ABS: 0.1 10*3/uL (ref 0–0.1)
Basophils Relative: 0 %
EOS ABS: 0.2 10*3/uL (ref 0–0.7)
EOS PCT: 1 %
HCT: 40.3 % (ref 35.0–47.0)
HEMOGLOBIN: 13.1 g/dL (ref 12.0–16.0)
LYMPHS ABS: 1.3 10*3/uL (ref 1.0–3.6)
Lymphocytes Relative: 8 %
MCH: 28.8 pg (ref 26.0–34.0)
MCHC: 32.7 g/dL (ref 32.0–36.0)
MCV: 88.1 fL (ref 80.0–100.0)
Monocytes Absolute: 0.9 10*3/uL (ref 0.2–0.9)
Monocytes Relative: 5 %
NEUTROS ABS: 14.2 10*3/uL — AB (ref 1.4–6.5)
NEUTROS PCT: 86 %
PLATELETS: 274 10*3/uL (ref 150–440)
RBC: 4.57 MIL/uL (ref 3.80–5.20)
RDW: 15.3 % — ABNORMAL HIGH (ref 11.5–14.5)
WBC: 16.6 10*3/uL — AB (ref 3.6–11.0)

## 2015-05-07 LAB — PROTIME-INR
INR: 2.64
Prothrombin Time: 27.8 seconds — ABNORMAL HIGH (ref 11.4–15.0)

## 2015-05-07 LAB — LIPASE, BLOOD: Lipase: 89 U/L — ABNORMAL HIGH (ref 11–51)

## 2015-05-07 LAB — TROPONIN I: Troponin I: <0.03 ng/mL (ref <0.031)

## 2015-05-07 MED ORDER — ONDANSETRON HCL 4 MG/2ML IJ SOLN
INTRAMUSCULAR | Status: AC
  Administered 2015-05-07: 4 mg via INTRAVENOUS
  Filled 2015-05-07: qty 2

## 2015-05-07 MED ORDER — ONDANSETRON HCL 4 MG/2ML IJ SOLN
4.0000 mg | Freq: Once | INTRAMUSCULAR | Status: AC
  Administered 2015-05-07: 4 mg via INTRAVENOUS

## 2015-05-07 MED ORDER — IOHEXOL 240 MG/ML SOLN
25.0000 mL | Freq: Once | INTRAMUSCULAR | Status: AC | PRN
  Administered 2015-05-07: 25 mL via ORAL

## 2015-05-07 MED ORDER — ONDANSETRON 4 MG PO TBDP
ORAL_TABLET | ORAL | Status: AC
  Administered 2015-05-07: 4 mg via ORAL
  Filled 2015-05-07: qty 1

## 2015-05-07 MED ORDER — IOHEXOL 300 MG/ML  SOLN
100.0000 mL | Freq: Once | INTRAMUSCULAR | Status: AC | PRN
  Administered 2015-05-07: 100 mL via INTRAVENOUS

## 2015-05-07 MED ORDER — ONDANSETRON HCL 4 MG/2ML IJ SOLN
4.0000 mg | Freq: Once | INTRAMUSCULAR | Status: AC
Start: 2015-05-07 — End: 2015-05-07
  Administered 2015-05-07: 4 mg via INTRAVENOUS

## 2015-05-07 MED ORDER — SODIUM CHLORIDE 0.9 % IV BOLUS (SEPSIS)
1000.0000 mL | Freq: Once | INTRAVENOUS | Status: AC
  Administered 2015-05-07: 1000 mL via INTRAVENOUS

## 2015-05-07 MED ORDER — LOPERAMIDE HCL 2 MG PO CAPS
2.0000 mg | ORAL_CAPSULE | Freq: Once | ORAL | Status: AC
  Administered 2015-05-07: 2 mg via ORAL
  Filled 2015-05-07: qty 1

## 2015-05-07 MED ORDER — SODIUM CHLORIDE 0.9 % IV SOLN
1000.0000 mL | Freq: Once | INTRAVENOUS | Status: AC
  Administered 2015-05-07: 1000 mL via INTRAVENOUS

## 2015-05-07 MED ORDER — ONDANSETRON 4 MG PO TBDP: 4.0000 mg | ORAL_TABLET | Freq: Three times a day (TID) | ORAL | Status: AC | PRN

## 2015-05-07 MED ORDER — LOPERAMIDE HCL 2 MG PO TABS: 2.0000 mg | ORAL_TABLET | Freq: Four times a day (QID) | ORAL | Status: AC | PRN

## 2015-05-07 MED ORDER — ONDANSETRON 4 MG PO TBDP
4.0000 mg | ORAL_TABLET | Freq: Once | ORAL | Status: AC
  Administered 2015-05-07: 4 mg via ORAL

## 2015-05-07 NOTE — ED Notes (Signed)
Patient given instructions regarding importance of hydration and taking medications.  Patient verbalized understanding.  Patient instructed to return to ED if symptoms do not improve.

## 2015-05-07 NOTE — ED Provider Notes (Signed)
[facility] Emergency Department Provider Note  ____________________________________________  Time seen: Approximately 4:00 AM  I have reviewed the triage vital signs and the nursing notes.   HISTORY  Chief Complaint Nausea; Emesis; and Diarrhea    HPI Brandi Newman is a 69 y.o. female who comes into the hospital today with vomiting, diarrhea chest pain and abdominal pain. Patient reports that the pain is in her epigastric area. The patient has a history of a hiatal hernia and she feels that the pain is in the hernia area. The pain started this evening around 10 PM. The patient reports that she's vomited so much that she's lost count. The emesis appears dark as well as with food. She is also had brown fluid like diarrhea. The patient reports that this has never happened before.The patient denies any sick contacts has not had any fevers. Her pain as a 7 out of 10 in intensity. She denies shortness of breath has been dizzy and lightheaded. The patient was concerned so she decided to come into the hospital for evaluation. The patient denies any sweats and reports that her chest pain seems to move into her back.    Past Medical History  Diagnosis Date  . Arthritis   . Hypertension   . Pulmonary embolism (HCC)     2 episodes    There are no active problems to display for this patient.   Past Surgical History  Procedure Laterality Date  . Cholecystectomy    . Shoulder arthroscopy Right   . Abdominal hysterectomy      Meds Lisinopril Metoprolol prilosec Folic acid crestor Calcium citrate jantoven Methotrexate prednisone  Allergies Codeine  History reviewed. No pertinent family history.  Social History Social History  Substance Use Topics  . Smoking status: Former Games developermoker  . Smokeless tobacco: None  . Alcohol Use: No    Review of Systems Constitutional: No fever/chills Eyes: No visual changes. ENT: No sore throat. Cardiovascular:  chest  pain. Respiratory: Denies shortness of breath. Gastrointestinal: abdominal pain. nausea,  vomiting. diarrhea.   Genitourinary: Negative for dysuria. Musculoskeletal: Negative for back pain. Skin: Negative for rash. Neurological: dizziness  10-point ROS otherwise negative.  ____________________________________________   PHYSICAL EXAM:  VITAL SIGNS: ED Triage Vitals  Enc Vitals Group     BP 05/07/15 0246 117/81 mmHg     Pulse Rate 05/07/15 0246 121     Resp 05/07/15 0246 22     Temp 05/07/15 0246 98 F (36.7 C)     Temp Source 05/07/15 0246 Oral     SpO2 05/07/15 0246 98 %     Weight 05/07/15 0246 175 lb (79.379 kg)     Height 05/07/15 0246 5\' 3"  (1.6 m)     Head Cir --      Peak Flow --      Pain Score 05/07/15 0248 8     Pain Loc --      Pain Edu? --      Excl. in GC? --     Constitutional: Alert and oriented. Well appearing and in moderate distress. Eyes: Conjunctivae are normal. PERRL. EOMI. Head: Atraumatic. Nose: No congestion/rhinnorhea. Mouth/Throat: Mucous membranes are moist.  Oropharynx non-erythematous. Cardiovascular: Normal rate, regular rhythm. Grossly normal heart sounds.  Good peripheral circulation. Respiratory: Normal respiratory effort.  No retractions. Lungs CTAB. Gastrointestinal: Soft with epigastric tenderness to palpation. No distention. Positive bowel sounds Musculoskeletal: No lower extremity tenderness nor edema.   Neurologic:  Normal speech and language.  Skin:  Skin is warm, dry and intact.  Psychiatric: Mood and affect are normal.   ____________________________________________   LABS (all labs ordered are listed, but only abnormal results are displayed)  Labs Reviewed  CBC WITH DIFFERENTIAL/PLATELET - Abnormal; Notable for the following:    WBC 16.6 (*)    RDW 15.3 (*)    Neutro Abs 14.2 (*)    All other components within normal limits  COMPREHENSIVE METABOLIC PANEL - Abnormal; Notable for the following:    Potassium 3.4 (*)     Glucose, Bld 140 (*)    BUN 22 (*)    All other components within normal limits  LIPASE, BLOOD - Abnormal; Notable for the following:    Lipase 89 (*)    All other components within normal limits  PROTIME-INR - Abnormal; Notable for the following:    Prothrombin Time 27.8 (*)    All other components within normal limits  TROPONIN I  URINALYSIS COMPLETEWITH MICROSCOPIC ([facility] ONLY)   ____________________________________________  EKG  ED ECG REPORT I, Rebecka Apley, the attending physician, personally viewed and interpreted this ECG.   Date: 05/07/2015  EKG Time: 251  Rate: 115  Rhythm: sinus tachycardia  Axis: normal  Intervals:none  ST&T Change: none  ____________________________________________  RADIOLOGY  CXR: No evidence of active pulmonary disease. Esophageal hiatal hernia behind the heart  CT abd/pelvis: No acute or focal lesion to explain the patient's symptoms. Colon is fluid filled compatible with diarrhea. No focal inflammation present. Granulomatous changes seen in spleen, mild fatty inflitration of the lover without focal lesion.  ____________________________________________   PROCEDURES  Procedure(s) performed: None  Critical Care performed: No  ____________________________________________   INITIAL IMPRESSION / ASSESSMENT AND PLAN / ED COURSE  Pertinent labs & imaging results that were available during my care of the patient were reviewed by me and considered in my medical decision making (see chart for details).  This is a 69 year old female who comes in today with vomiting diarrhea and upper abdominal and chest pain. According to the patient's lipase appears as though she has a mild case of pancreatitis. I will check the patient's diarrhea for blood or infection. The patient did receive ns 1L iv and zofran. I will reassess her once I have received her results.  The patient;s CT scan is negative. Although the patient has continued tachycardia  she would like to be discharged to home. I encouraged the patient to continue oral hydration with electrolyte fluid.    ____________________________________________   FINAL CLINICAL IMPRESSION(S) / ED DIAGNOSES  Final diagnoses:  Gastroenteritis      Rebecka Apley, MD 05/07/15 [phone]

## 2015-05-07 NOTE — ED Notes (Signed)
Pt nauseous

## 2015-05-07 NOTE — ED Notes (Signed)
Patient presents to Emergency Department via EMS with complaints of NVD for last 4-5 hours, pt reports history of haital hernia, reports pain in substernal abodomen "feels like someone punching me in the gut", neg cardiac history, hx of PEs (2 separate episodes) and pt on Jantoven

## 2015-05-07 NOTE — ED Notes (Signed)
CT called for pt finished contrast  

## 2015-06-01 ENCOUNTER — Ambulatory Visit: Payer: MEDICARE | Attending: Hematology & Oncology

## 2015-06-01 ENCOUNTER — Encounter: Admit: 2015-06-01 | Discharge: 2015-06-01 | Payer: MEDICARE

## 2015-06-01 DIAGNOSIS — Z7901 Long term (current) use of anticoagulants: Secondary | ICD-10-CM

## 2015-06-01 LAB — PROTIME-INR: INR: 2.8 — ABNORMAL HIGH (ref 0.8–1.2)

## 2015-06-01 NOTE — Progress Notes (Signed)
Please refer to the lab/ visit from today for additional documentation on labs drawn.  PT/INR,  drawn today. Patient without prior bruising, discoloration or other noted abnormalities to the draw site.  There was minimalbruising or bleeding noted post blood draw at the site.  Patient without complaints.

## 2015-06-01 NOTE — Progress Notes (Signed)
INR 2.8. Instructed to continue coumadin 3 mg qd and check the INR in 3 weeks. Verbalized understanding.

## 2015-06-22 ENCOUNTER — Encounter: Admit: 2015-06-22 | Discharge: 2015-06-22 | Payer: MEDICARE

## 2015-06-22 ENCOUNTER — Ambulatory Visit: Payer: MEDICARE | Attending: Hematology & Oncology

## 2015-06-22 DIAGNOSIS — Z7901 Long term (current) use of anticoagulants: Secondary | ICD-10-CM

## 2015-06-22 LAB — PROTIME-INR: INR: 3.6 — ABNORMAL HIGH (ref 0.8–1.2)

## 2015-06-22 NOTE — Progress Notes (Signed)
Please refer to the lab  visit from today for additional documentation on labs drawn.  PT/INR drawn today. Patient with prior bruising, discoloration or other noted abnormalities to the draw site.  There was Minimal bruising or bleeding noted post blood draw at the site.  Patient without  Complaints.  Dose 3 mg daily  Value 3.6

## 2015-06-22 NOTE — Progress Notes (Signed)
INR 3.6. She will continue coumadin 3 mg qd. Check the INR in 4 weeks. Verbalized understanding.

## 2015-07-20 ENCOUNTER — Encounter: Admit: 2015-07-20 | Discharge: 2015-07-20 | Payer: MEDICARE

## 2015-07-20 ENCOUNTER — Ambulatory Visit: Attending: Hematology & Oncology

## 2015-07-20 DIAGNOSIS — Z7901 Long term (current) use of anticoagulants: Secondary | ICD-10-CM

## 2015-07-20 LAB — PROTIME-INR: INR: 2.9 — ABNORMAL HIGH (ref 0.8–1.2)

## 2015-07-20 NOTE — Progress Notes (Signed)
Catherine Brewer's INR today is   INR   Date Value Ref Range Status   07/20/2015 2.9 (H) 0.8 - 1.2 Final     Comment:     Normal                       0.8 - 1.2  Low Anticoagulation          1.5 - 2.0  Moderate Anticoagulation     2.0 - 3.0  High Anticoagulation         2.5 - 4.0  [facility],[place] [facility] [address],[place],[phone],Dr. Loralee Pacas       Current Coumadin dose is  daily    Per MD, Catherine Brewer instructed to continue  daily and RTC for next INR in One Month.    Catherine Brewer verbalized understanding.    The patient was seen today for evaluation and management of anticoagulation therapy. Under the direct supervision of Dr.Kudalkar  the plan will be as above. The medication list was reviewed today with the patient. All questions were answered to the patient's satisfaction. They will call with any questions or concerns.

## 2015-07-20 NOTE — Progress Notes (Signed)
Please refer to the lab visit from today for additional documentation on labs drawn.  PT/INR drawn today. Patient without prior bruising, discoloration or other noted abnormalities to the draw site.  There was minimalbruising or bleeding noted post blood draw at the site.  Patient without complaints.

## 2015-08-17 ENCOUNTER — Ambulatory Visit: Payer: MEDICARE | Attending: Hematology & Oncology

## 2015-08-17 ENCOUNTER — Encounter: Admit: 2015-08-17 | Discharge: 2015-08-17 | Payer: MEDICARE

## 2015-08-17 DIAGNOSIS — Z7901 Long term (current) use of anticoagulants: Secondary | ICD-10-CM

## 2015-08-17 LAB — PROTIME-INR: INR: 2.6 — ABNORMAL HIGH (ref 0.8–1.2)

## 2015-08-17 NOTE — Progress Notes (Signed)
INR 2.6. She will continue coumadin 3 mg qd. Check the INR in 4 weeks. Verbalized understanding

## 2015-08-17 NOTE — Progress Notes (Signed)
Please refer to the lab/ visit from today for additional documentation on labs drawn.  PT/INR,  drawn today. Patient without prior bruising, discoloration or other noted abnormalities to the draw site.  There was minimalbruising or bleeding noted post blood draw at the site.  Patient without complaints.

## 2015-09-20 ENCOUNTER — Ambulatory Visit: Attending: Hematology & Oncology

## 2015-09-20 ENCOUNTER — Ambulatory Visit: Admit: 2015-09-20 | Discharge: 2015-09-20 | Payer: MEDICARE | Attending: Hematology & Oncology

## 2015-09-20 DIAGNOSIS — Z7901 Long term (current) use of anticoagulants: Secondary | ICD-10-CM

## 2015-09-20 LAB — PROTIME-INR: INR: 3.2 — ABNORMAL HIGH (ref 0.8–1.2)

## 2015-09-20 NOTE — Progress Notes (Signed)
HEMATOLOGY FOLLOW-UP:       Primary Hematologist:  Pattricia BossPrasad R Rees Santistevan     DIAGNOSIS:      1. Recurrent pulmonary embolisms.    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    TREATMENTS GIVEN:     Xarelto - stooped due to copayment issues    CURRENT TREATMENT:     Warfarin    OTHER MEDICAL PROBLEMS:     1. RA  2. HTN  3. Hyperlipidemia    INTERVAL HISTORY:       Patient returns to office for f/u  Doing well.   Taking SL B12.    No headaches, Dizziness  No mouth sores,   No CP, SOB, Cough  No nausea. Anorexia +. No constipation, diarrhea.  No urinary frequency.  No leg swelling  No tingling, numbness in extremities.  No skin rashes.  Has arthritic pain  No fever, chills, night sweats.  No external bleeding      PHYSICAL EXAM:       BP 112/78   Pulse 68   Temp 97.9 ??F (36.6 ??C) (Oral)    Resp 16   Ht 5' (1.524 m)   Wt 175 lb 12.8 oz (79.7 kg)   BMI 34.33 kg/m2 Performance Status 0     General appearance: alert and cooperative  Head: Normocephalic, without obvious abnormality, atraumatic  Eyes: No Pallor, icterus  Oral mucosa: No mucositis, thrush  Neck: Supple, No fullness  Lungs: Clear to auscultation bilaterally, no audible rales, wheezes or crackles. Respiratory efforts normal.  Heart: Regular rate and rhythm, S1, S2 normal  Abdomen: Soft, non-tender; bowel sounds normal; no masses,  no organomegaly. Not distended.  Extremities: without cyanosis, clubbing, edema or asymmetry  Skin: No jaundice, purpura or petechiae  Neuro: No focal deficits, cranial nerve palsies      LABS:     CBC:   No results found for: WBC    BMP:   No results found for: NA    HEPATIC:  No results found for: AST    TUMOR MARKERS:   No results found for: PSA      IMAGING:         ASSESSMENT AND PLAN:       1.  Patient with recurrent pulmonary embolisms.  The patient is currently on Warfarin and is tolerating it well.  It will be adjusted to maintain INR between 3 and 3.5.    2. Homocysteinemia: SL B12 will be continued.    CBC and  CMP from Feb 2017 from [facility] were reviewed    F/U in 1 year    Pattricia BossPrasad R Dayven Linsley, MD       cc:  Raeanne BarryJoel Reginelli, M.D.  Zachery DauerBerthold Pembaur, M.D.  Nira ConnSri Koneru, M.D.

## 2015-09-20 NOTE — Progress Notes (Signed)
INR 3.2. Instructed by Dr Arbie CookeyKudalkar to continue coumadin 3 mg qd. Check the INR in 4 week.

## 2015-09-20 NOTE — Progress Notes (Signed)
Please refer to the lab/MD visit from today for additional documentation on labs drawn.  PT/INR, cbc drawn today. Patient without prior bruising, discoloration or other noted abnormalities to the draw site.  There was minimalbruising or bleeding noted post blood draw at the site.  Patient without complaints.

## 2015-09-28 ENCOUNTER — Ambulatory Visit: Admit: 2015-09-28 | Payer: MEDICARE

## 2015-09-28 DIAGNOSIS — M2669 Other specified disorders of temporomandibular joint: Secondary | ICD-10-CM

## 2015-09-28 MED ORDER — dexamethasone (DECADRON) 4 mg/mL injection
4 | INTRAMUSCULAR | 0.00 refills | 10.00000 days | Status: AC
Start: 2015-09-28 — End: ?

## 2015-09-28 NOTE — Unmapped (Signed)
Grace Perry presents to the clinic for exam.  Reviewed chief complaint, medical history, dental history, allergies, and medications prior to examination.    Chief Complaint:   Chief Complaint   Patient presents with   ??? Jaw Noises     right   ??? Oral Pain     max right teeth.       Subjective  HPI:     Grace Perry reports maxillary right teeth pain.  Started 2-3 weeks ago out of the blue, occurred  about 4 -5 times since, lasting a few hours. Was eating around that time and heard a crackling noise in right joint. The teeth pain starts and stops spontaneous. Aching and throbbing at level 5/10. Does not take medication for the  pain. Flossing and water pick aggravate the pain.  She is also experencing  ear pain, neck pain, shoulder pain and back pain.  The teeth pain is intermittent..  It was caused by no reason - pain just started. It not is aggravated by chewing, eating certain foods, hot and cold food/drinks, opening mouth wide and yawning.      Around the time the teeth pain started, noticed right TMJ pain and aware of noises, cracking, grinding, No right jaw pain with function, eating.    Dentist took panoramic xray  and saw changes in right condyle    Has had right sided headaches for years. At present occur 2 times a week. Bilateral Temple.  No phono or photo phobia, no nausea, exertion not aggravating. Feels like pressure. Last  few hours and at times a couple of days for major headache which happens infrequent (1 yr ago).              ROS:   Review of Systems   HENT:        Blood clotting disorder.   Musculoskeletal:        Rheumatoid Arthritis    Osteoporosis   Neurological: Positive for headaches.         Objective:     PHYSICAL EXAM     Cursory Cervical    Posture:  Normal       Flexion:  Normal   Extension:  Normal   R Lateral Flexion:  Normal   L Lateral Flexion:  Normal   R Rotation:  Normal   L Rotation:  Normal     Cursory Neurological - Cranial Nerves     Right Left   Olfactory: Normal Normal   Optic      Rosenbaum Vision Screen: Normal Normal   Peripheral Vision Screen: Normal Normal                  Trochlear Abducens     Lateral Gaze: Normal Normal   Vertical Gaze: Normal Normal   Nystagmus: Normal Normal   Diplopia: None None   Sensory     Trigeminal                    V1 light touch: Normal 100/100 Normal 100/100   V2 light touch: Normal 100/100 Normal 100/100   V3 light touch: Normal 100/100 Normal 100/100   Motor: Normal Normal   Corneal Reflex: Normal Normal   Facial     Forehead (wrinkles): Normal Normal   Smile: Normal Normal   Close eyes tight: Normal Normal   Platysma Flex: Normal Normal   Auditory     Fingers: Normal Normal   Glossopharyngeal Vagus Accessory  Gag: Normal Normal   Palate: Normal Normal   Shoulder Evaluation (Trapezius Strength): Normal Normal   Turn Head (SCM Strength): Normal Normal   Hypoglossal     Tongue Out: Normal Normal       MASTICATORY EXAM     Range of Motion     ROM (maximum)-mm Pain Location   Comfort Open: 35    Active Open: 40    Passive Open: 45 Right TMJ   Protrusive: 8    Rt Laterotrusive: 9    Lt Laterotrusive: 9      End Feel: Soft   Jaw Opening: Normal  .   Jaw Protrusion: Normal  .     TMJ Noise Dysfunction     Right Open Right Close Left Open Left Close   Early (0-4mm): None None None None   Mid (15-30 mm): None None None None   Wide Open (30+mm): None None None None   Crepitus: mild mild None None       ORAL HABITS     Objective Evidence of Oral Habits    Accelerated wear on anterior teeth: yes   Accelerated wear on posterior teeth: No   Multiple teeth tender to percussion: No   Tongue ridging: No   Mucosal/Cheek ridging: No   Masseter hypertrophy: No   Cheek/Lip biling lesion: No   Natural Teeth: yes   Maxillary Prosthesis: No   Mandibular Prosthesis: No               OCCLUSAL     Angle Classification:  RIght:II.1  Left: II.1   Teeth malposed (2 or more teeth): No   Occlusal plane problem: No   Deep bite: No   Cross bite: No   Midline off: No   Assymetry of  face or jaw: No       Over bite: 2mm   Over jet: 7mm         Intercuspal Position    Tooth Contacts on Right:    2nd Molar: Yes   1st Molar: Yes   2nd Premolar: Yes   1st Premolar: Yes   Canine: Yes   Lateral Incisor: Yes   Central Incisor: Yes       Intercuspal Position    Tooth Contacts on Left:    2nd Molar: Yes   1st Molar: Yes   2nd Premolar: Yes   1st Premolar: Yes   Canine: Yes   Lateral Incisor: Yes   Central Incisor: Yes       PALPATION     Pain/Tenderness  1=Mild, 2=Moderate, 3=Severe) Right Left   TMJ:  2 None   Temporal Tendon:  None None   Lateral Pterygoid:  None None   Medial Pterygoid: None None   Medial Pterygoid:  None None   Deep Masseter:  None None   Superficial Masseter:  None None   Temporalis: None None   Sternocleidomastoid (SCM): 2 2   Splenius Capitis:   None None                  Upper/Middle Trapezius: 2 2       SOFT TISSUE EXAM     Tongue: Normal   Undertongue: Normal   Lips: Normal   Cheeks: Normal   Nodes: Normal   Throat: Normal   Vestibule: Normal   Palate: Normal   Teeth: Normal           Alveolar Ridge: Normal   Exostosis: bilateral lingual  And lingual max  right.       ORODENTAL EXAM     Bite Pressure: Normal   Transillumination: Normal   Percussion: Normal   Vitality: Normal   Palpation: Normal   Pin Prick: Normal           Radiologic Finding   Copy of pan, right condyle not well defined and not rounded- Has Rheumatoid Artheritis       Assessment/Plan:   Capsulitis right TMJ- cervical myofascial pain-Headaches    PLAN:  Precert oral appliance  Sent to PT- [facility] ionto right TMJ  Further evaluation if maxillary rt teeth continue to hurt.   Send to Oral Surgery for exostosis, possible CT head.  Sent to Neurologist if headache continues      Time Spent With Patient:   The patient was seen for 60 minutes face to face.  Greater than 50% of the visit was devoted to patient counseling.      Counseling:   ??? Appliance/Splint  ??? No Chewing Gum  ??? Habit Modifications  o Discussed awareness  of teeth clenching and/or teeth grinding  o Instructed to not sleep on stomach  o Instructed to observe for non-functioning jaw movements  o Instructed to not lean on jaw  ??? Soft Diet  ??? Personal Stressors  o Discussed / taught meditation  o Discussed / taught self-awareness  o Discussed / taught stretching  ??? Muscle Therapy of the Jaw  o Taught / reviewed jaw stretching exercises  o Taught / reviewed jaw neck exercises  o Taught / reviewed jaw tongue exercises  o Taught / reviewed applying heat or cold  ??? Discussed / prescribed medication(s)

## 2015-10-01 MED ORDER — JANTOVEN 6 MG PO TABS
6 MG | ORAL_TABLET | ORAL | 0 refills | Status: AC
Start: 2015-10-01 — End: ?

## 2015-10-12 ENCOUNTER — Encounter

## 2015-10-13 NOTE — Unmapped (Signed)
Called and spoke with patient and advised of appliance cost of $840. Patient voiced understanding and stated she was not interested due to cost. She will continue with PT and then possibly come back and see DMD for follow up.

## 2015-10-19 ENCOUNTER — Encounter: Admit: 2015-10-19 | Discharge: 2015-10-19 | Payer: MEDICARE

## 2015-10-19 ENCOUNTER — Ambulatory Visit: Payer: MEDICARE | Attending: Hematology & Oncology

## 2015-10-19 DIAGNOSIS — Z7901 Long term (current) use of anticoagulants: Secondary | ICD-10-CM

## 2015-10-19 LAB — PROTIME-INR: INR: 2.7 — ABNORMAL HIGH (ref 0.8–1.2)

## 2015-10-19 NOTE — Progress Notes (Signed)
Please refer to the lab  visit from today for additional documentation on labs drawn.  PT/INR,drawn today. Patient without prior bruising, discoloration or other noted abnormalities to the draw site.  There was minimalbruising or bleeding noted post blood draw at the site.  Patient without complaints.

## 2015-10-19 NOTE — Progress Notes (Signed)
INR 2.7. Instructed  to continue coumadin 3 mg qd. Check the INR in 4 week. Verbalized understanding.

## 2015-10-20 MED ORDER — ENOXAPARIN SODIUM 80 MG/0.8ML SC SOLN
80 MG/0.8ML | INJECTION | SUBCUTANEOUS | 0 refills | Status: AC
Start: 2015-10-20 — End: ?

## 2015-10-20 NOTE — Telephone Encounter (Signed)
Catherine Brewer is [facility] for evaluation for cataract surgery. She is very concerned that if she needs to stop coumadin and she has to take lovenox injections the insurance may not pay for it. Prescription for lovenox sent to the pharmacy to check insurance payment.   Call placed to the pharmacy. Patient's cost for 10 injections of lovenox is $88.48. The pharmacist is asked to hold the prescription until needed. Catherine Brewer informed. Verbalized understanding.

## 2015-10-20 NOTE — Telephone Encounter (Signed)
David Swazil[place]Jordan (Spouse) called regarding wanting to speak to SalinenoShirley about up coming procedure and medication. David [place]azil[place]Jordan may be reached at [phone]

## 2015-11-01 ENCOUNTER — Ambulatory Visit: Admit: 2015-11-01 | Payer: MEDICARE

## 2015-11-01 DIAGNOSIS — M7918 Myalgia, other site: Secondary | ICD-10-CM

## 2015-11-01 NOTE — Unmapped (Signed)
Addended by: Benjamine Mola on: 11/01/2015 09:25 AM     Modules accepted: Level of Service

## 2015-11-01 NOTE — Unmapped (Addendum)
[place] presents to the clinic for exam.  Reviewed chief complaint, medical history, dental history, allergies, and medications prior to examination.    Chief Complaint:   Chief Complaint   Patient presents with   ??? Jaw Pain     right       Subjective  HPI:   Saw Rhoda 6 times , feels 100% improvement. Can not afford appliance. Eating normal except hard and chewy foods for a while. Doing  head and neck, back exercises. Right noise crepitus still present    ROS:   Review of Systems      Objective:     PHYSICAL EXAM         MASTICATORY EXAM     Range of Motion     ROM (maximum)-mm Pain Location   Comfort Open: 35    Active Open: 40    Passive Open: 47    Protrusive: 7    Rt Laterotrusive: 9    Lt Laterotrusive: 9      End Feel: Soft   Jaw Opening: Normal  .   Jaw Protrusion: Normal  .     TMJ Noise Dysfunction     Right Open Right Close Left Open Left Close   Early (0-16mm): None None None None   Mid (15-30 mm): None None None None   Wide Open (30+mm): None None None None   Crepitus: None None None None       ORAL HABITS     Objective Evidence of Oral Habits    Accelerated wear on anterior teeth: yes   Accelerated wear on posterior teeth: No   Multiple teeth tender to percussion: No   Tongue ridging: No   Mucosal/Cheek ridging: No   Masseter hypertrophy: No   Cheek/Lip biling lesion: No   Natural Teeth: yes   Maxillary Prosthesis: No   Mandibular Prosthesis: No               OCCLUSAL     Angle Classification:  RIght:II.1  Left: II.1   Teeth malposed (2 or more teeth): No   Occlusal plane problem: No   Deep bite: No   Cross bite: No   Midline off: No   Assymetry of face or jaw: No       Over bite: 2mm   Over jet: 5mm       Intercuspal Position    Tooth Contacts on Right:    2nd Molar: Yes   1st Molar: Yes   2nd Premolar: Yes   1st Premolar: Yes   Canine: Yes   Lateral Incisor: Yes   Central Incisor: Yes       Intercuspal Position    Tooth Contacts on Left:    2nd Molar: Yes   1st Molar: Yes   2nd Premolar: Yes    1st Premolar: Yes   Canine: Yes   Lateral Incisor: Yes   Central Incisor: Yes       PALPATION     Pain/Tenderness  1=Mild, 2=Moderate, 3=Severe) Right Left   TMJ:  None None   Temporal Tendon:  None None   Lateral Pterygoid:  None None   Medial Pterygoid: None None   Medial Pterygoid:  None None   Deep Masseter:  None None   Superficial Masseter:  None None   Temporalis: None None   Sternocleidomastoid (SCM): None None   Splenius Capitis:   None None  Upper/Middle Trapezius: None None          Assessment/Plan:   Capsulitis right TMJ- Resolved    Patient aware of clenching teeth during the day.Can not afford the appliance. Explained if symptoms return , especially in the morning, would need the appliance, since can not  control parafunction while sleeping..Tooth pain gone. Not feeling right joint pain    Plan   Do PT exercises   Finish PT-Rhoda  See in one month      Time Spent With Patient:   The patient was seen for 15 minutes face to face.  Greater than 50% of the visit was devoted to patient counseling.      Counseling:   ??? Appliance/Splint  ??? No Chewing Gum  ??? Habit Modifications  o Discussed awareness of teeth clenching and/or teeth grinding  o Instructed to not sleep on stomach  o Instructed to observe for non-functioning jaw movements  o Instructed to not lean on jaw  ??? Soft Diet  ??? Personal Stressors

## 2015-11-16 ENCOUNTER — Ambulatory Visit: Attending: Hematology & Oncology

## 2015-11-16 ENCOUNTER — Encounter: Admit: 2015-11-16 | Discharge: 2015-11-16 | Payer: MEDICARE

## 2015-11-16 DIAGNOSIS — Z7901 Long term (current) use of anticoagulants: Secondary | ICD-10-CM

## 2015-11-16 LAB — PROTIME-INR: INR: 2.5 — ABNORMAL HIGH (ref 0.8–1.2)

## 2015-11-16 NOTE — Progress Notes (Signed)
Please refer to the lab visit from today for additional documentation on labs drawn.  PT/INR drawn today. Patient without prior bruising, discoloration or other noted abnormalities to the draw site.  There was minimalbruising or bleeding noted post blood draw at the site.  Patient without complaints.

## 2015-11-16 NOTE — Progress Notes (Signed)
INR 2.5. (goal 3). Instructed to increase coumadin to alt 4 mg with 3 mg and check the INR in 2 weeks. Orders per Dr Arbie CookeyKudalkar. Verbalized understanding.

## 2015-11-30 ENCOUNTER — Encounter: Admit: 2015-11-30 | Discharge: 2015-11-30 | Payer: MEDICARE

## 2015-11-30 ENCOUNTER — Ambulatory Visit: Attending: Hematology & Oncology

## 2015-11-30 DIAGNOSIS — Z7901 Long term (current) use of anticoagulants: Secondary | ICD-10-CM

## 2015-11-30 LAB — PROTIME-INR: INR: 4.1 — ABNORMAL HIGH (ref 0.8–1.2)

## 2015-11-30 NOTE — Progress Notes (Signed)
Please refer to the lab/ visit from today for additional documentation on labs drawn.  PT/INR,  drawn today. Patient without prior bruising, discoloration or other noted abnormalities to the draw site.  There was minimalbruising or bleeding noted post blood draw at the site.  Patient without complaints.

## 2015-11-30 NOTE — Progress Notes (Signed)
IN 4.1. Instructed to decrease coumadin to 3 mg qd. Check the INR 12/15/15.Verbalized understanding.

## 2015-12-15 ENCOUNTER — Ambulatory Visit: Attending: Hematology & Oncology

## 2015-12-15 ENCOUNTER — Encounter: Admit: 2015-12-15 | Discharge: 2015-12-15 | Payer: MEDICARE

## 2015-12-15 DIAGNOSIS — Z7901 Long term (current) use of anticoagulants: Secondary | ICD-10-CM

## 2015-12-15 LAB — PROTIME-INR: INR: 3 — ABNORMAL HIGH (ref 0.8–1.2)

## 2015-12-15 NOTE — Progress Notes (Signed)
Please refer to the lab/ visit from today for additional documentation on labs drawn.  PT/INR,  drawn today. Patient without prior bruising, discoloration or other noted abnormalities to the draw site.  There was minimalbruising or bleeding noted post blood draw at the site.  Patient without complaints.

## 2015-12-15 NOTE — Progress Notes (Signed)
INR 3.1. Instructed to continue coumadon 3 mg qd and check the INR in 4 weeks.

## 2015-12-15 NOTE — Progress Notes (Signed)
Please refer to anticoagulation encounter.

## 2016-01-12 ENCOUNTER — Ambulatory Visit: Attending: Hematology & Oncology

## 2016-01-12 ENCOUNTER — Encounter: Admit: 2016-01-12 | Discharge: 2016-01-12 | Payer: MEDICARE

## 2016-01-12 DIAGNOSIS — Z7901 Long term (current) use of anticoagulants: Secondary | ICD-10-CM

## 2016-01-12 LAB — PROTIME-INR: INR: 2.5 — ABNORMAL HIGH (ref 0.8–1.2)

## 2016-01-12 NOTE — Progress Notes (Signed)
INR 2.5. Instructed to continue coumadin 3 mg qd and check the INR in 4 weeks.

## 2016-01-12 NOTE — Progress Notes (Signed)
Please refer to the lab  visit from today for additional documentation on labs drawn.  PT/INR,  drawn today. Patient without prior bruising, discoloration or other noted abnormalities to the draw site.  There was minimalbruising or bleeding noted post blood draw at the site.  Patient without complaints.

## 2016-01-13 NOTE — Telephone Encounter (Signed)
Discussed that the INR was 2.5 yesterday. She is having another surgery on her eye. They do not want the coumadin stopped. She will continue 3 mg qd. Check the INR in 4 weeks (she states this is the earliest she can come in because of her surgery).

## 2016-01-13 NOTE — Telephone Encounter (Signed)
The patient called (left VM) requesting a call back to discuss coumadin dose.

## 2016-02-09 ENCOUNTER — Encounter

## 2016-09-18 ENCOUNTER — Encounter: Attending: Hematology & Oncology

## 2017-01-20 IMAGING — CR DG CHEST 2V
1 series · 2 of 2 positions shown · non-contrast
Comparison: None.

CLINICAL DATA: Nausea, vomiting, diarrhea, chest pain, and
shortness of breath for 5 hours. Substernal abdominal pain.

EXAM:
CHEST  2 VIEW

[Series 1: dg chest 2 view · 0.14mm/px · 2 of 2 slices shown]
[im 1/2]
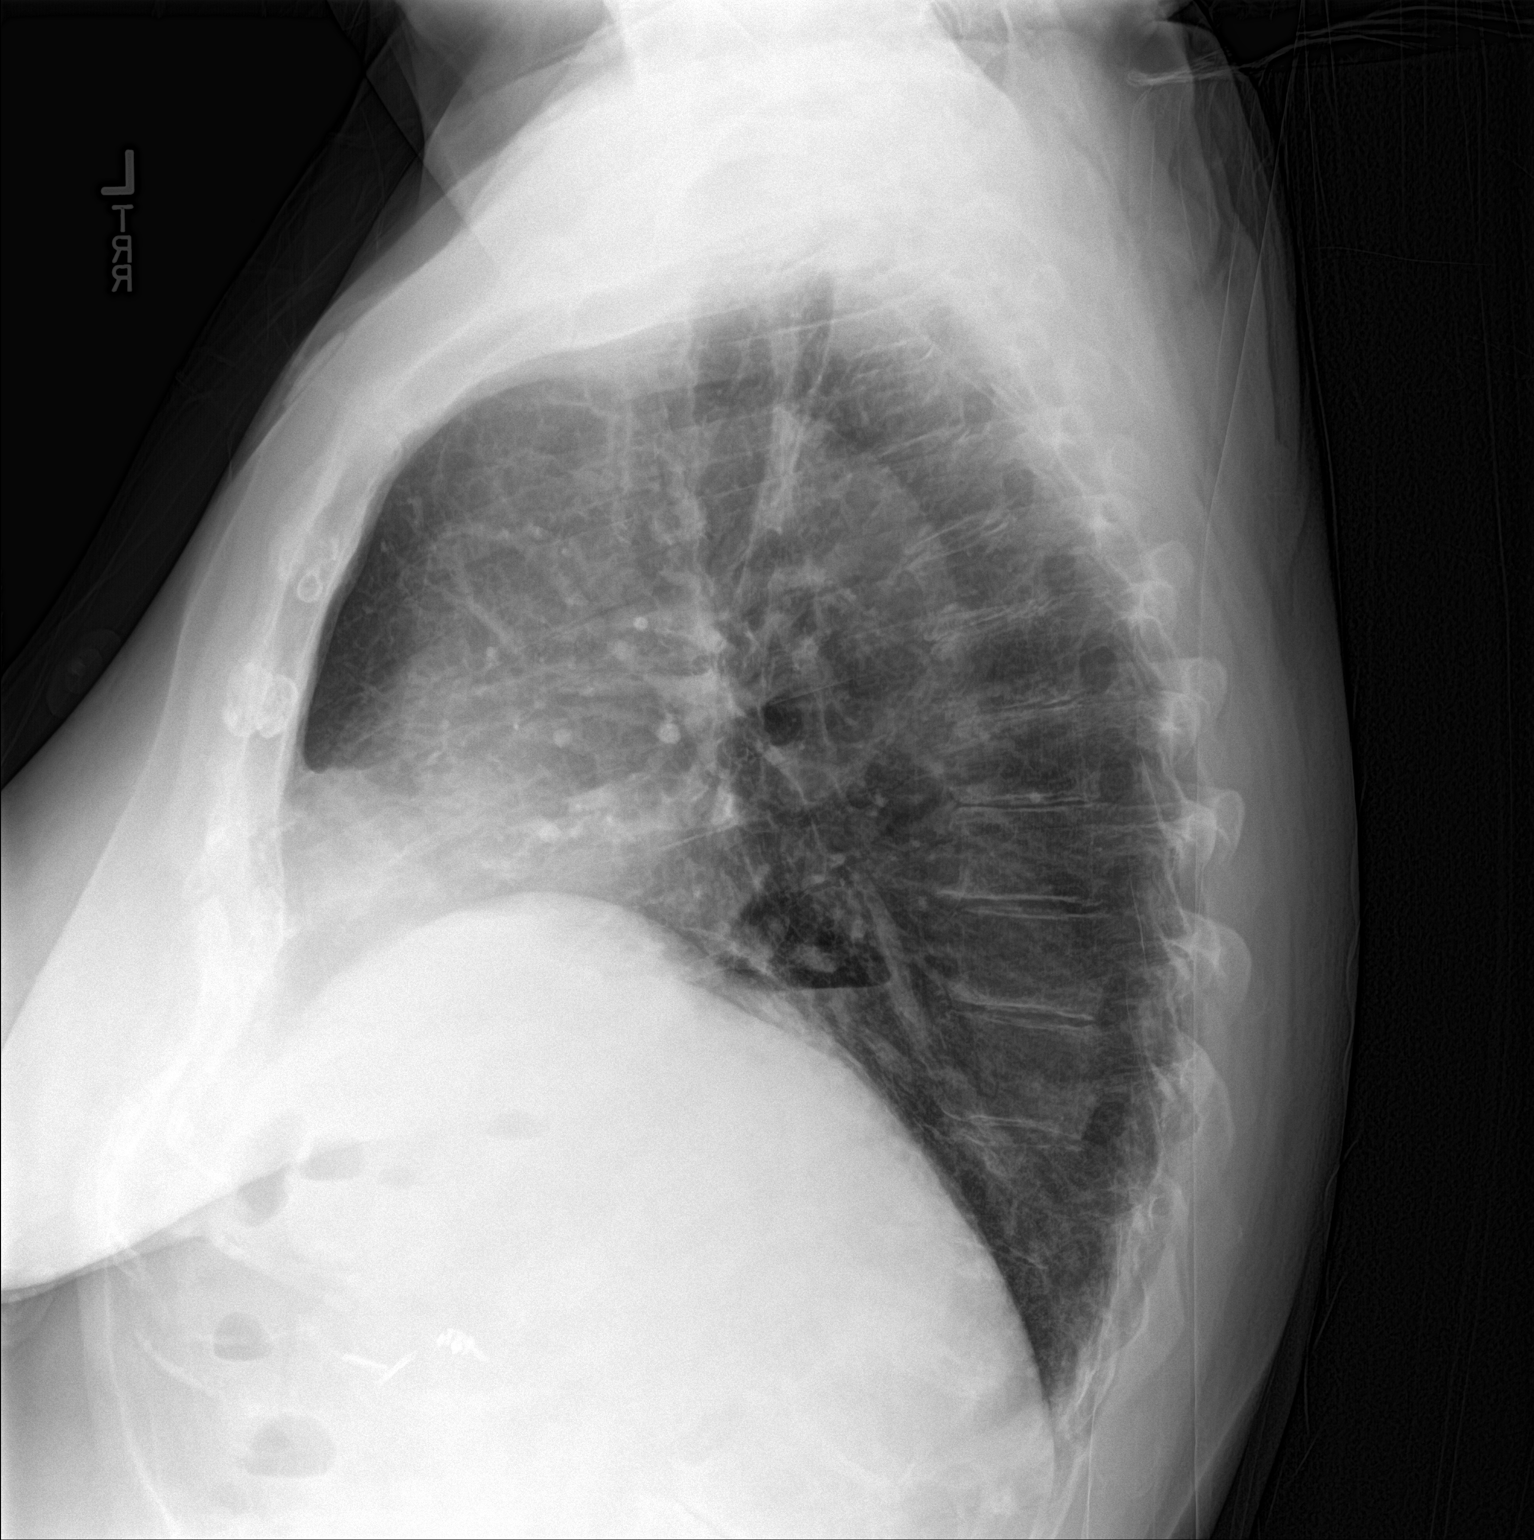
[im 2/2]
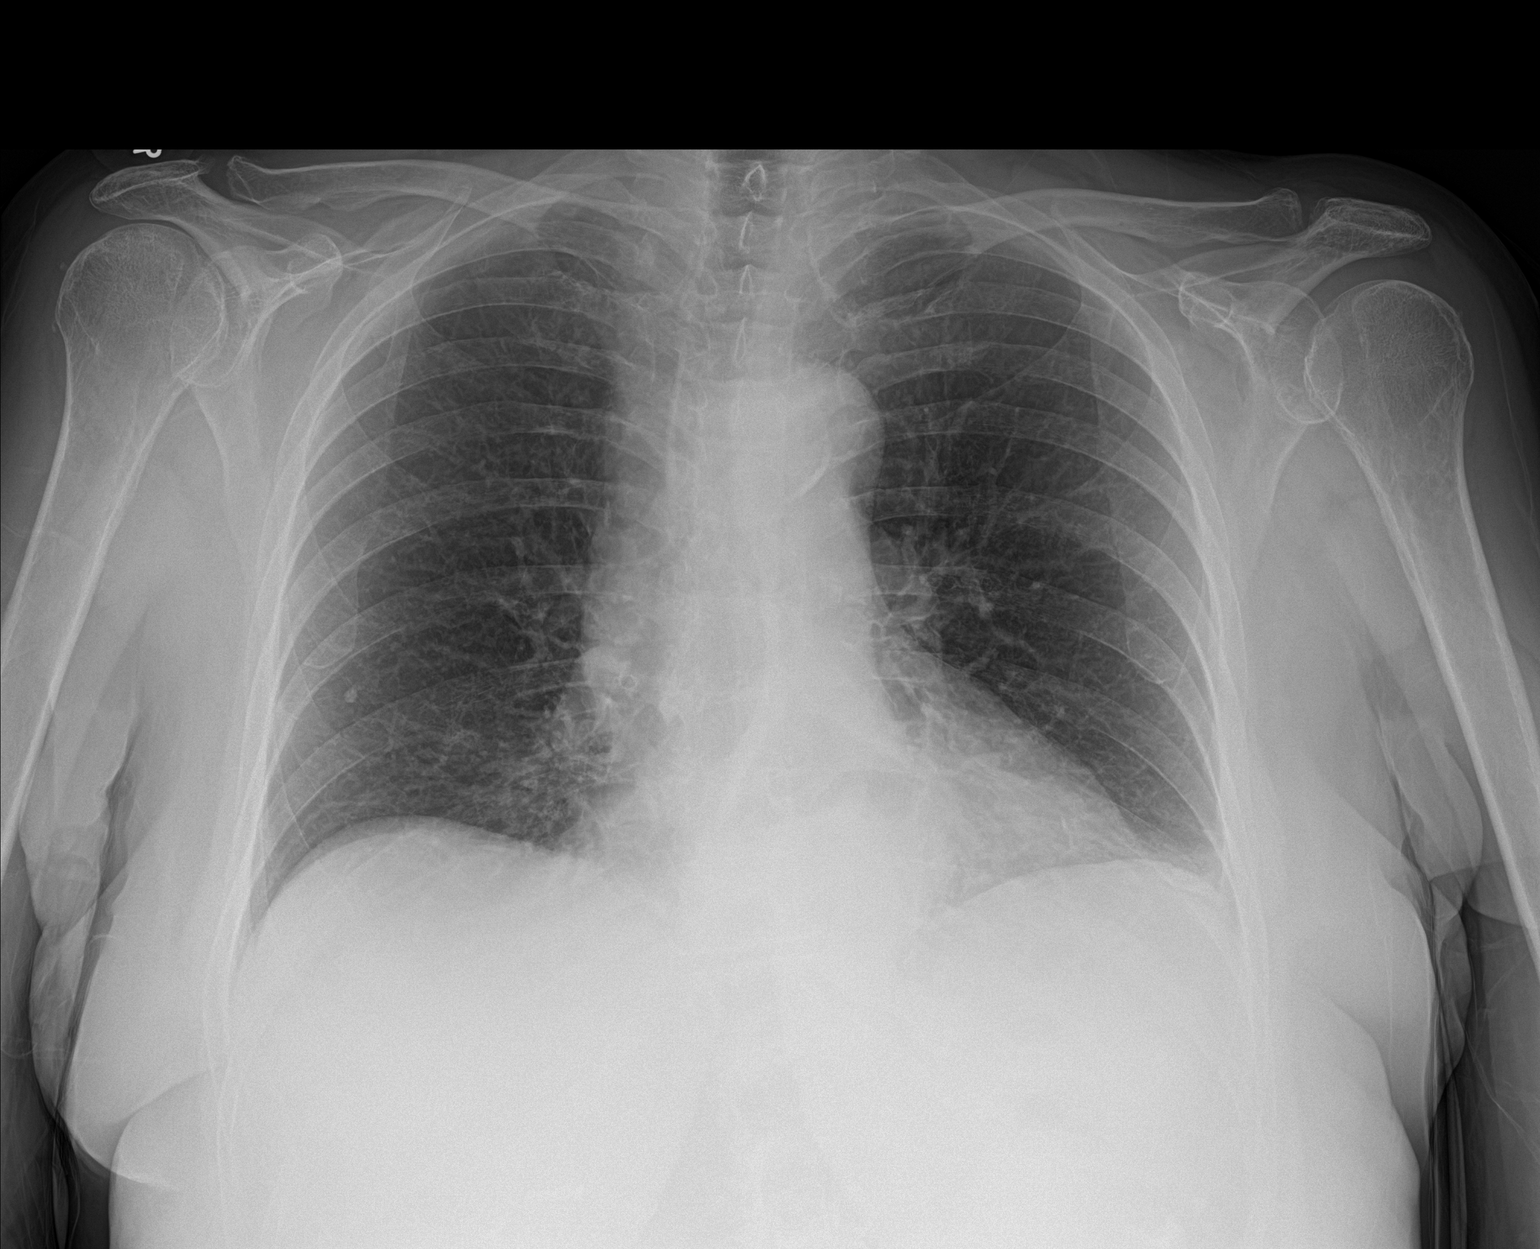

[2 of 2 positions shown; findings below may reference images not displayed]

FINDINGS: Normal heart size and pulmonary vascularity. No focal airspace
disease or consolidation in the lungs. Calcified granuloma on the
right. Calcified and tortuous aorta. Esophageal hiatal hernia behind
the heart. No pleural effusions. No pneumothorax. Surgical clips in
the right upper quadrant. Mild degenerative changes in the spine.
IMPRESSION: No evidence of active pulmonary disease. Esophageal hiatal hernia
behind the heart.
